# Patient Record
Sex: Female | Born: 1970 | Race: Black or African American | Hispanic: No | Marital: Single | Smoking: Never smoker
Health system: Southern US, Community
[De-identification: ages and names within clinical notes are randomized; demographics above are authoritative.]

## PROBLEM LIST (undated history)

## (undated) DIAGNOSIS — D509 Iron deficiency anemia, unspecified: Secondary | ICD-10-CM

## (undated) DIAGNOSIS — D219 Benign neoplasm of connective and other soft tissue, unspecified: Secondary | ICD-10-CM

## (undated) HISTORY — DX: Benign neoplasm of connective and other soft tissue, unspecified: D21.9

## (undated) HISTORY — PX: WISDOM TOOTH EXTRACTION: SHX21

## (undated) HISTORY — DX: Iron deficiency anemia, unspecified: D50.9

---

## 1999-03-13 ENCOUNTER — Other Ambulatory Visit: Admission: RE | Admit: 1999-03-13 | Discharge: 1999-03-13 | Payer: Self-pay | Admitting: Family Medicine

## 2000-07-30 HISTORY — PX: MYOMECTOMY: SHX85

## 2002-01-28 ENCOUNTER — Other Ambulatory Visit: Admission: RE | Admit: 2002-01-28 | Discharge: 2002-01-28 | Payer: Self-pay | Admitting: Obstetrics and Gynecology

## 2003-07-20 ENCOUNTER — Other Ambulatory Visit: Admission: RE | Admit: 2003-07-20 | Discharge: 2003-07-20 | Payer: Self-pay | Admitting: Obstetrics and Gynecology

## 2006-11-25 ENCOUNTER — Encounter: Admission: RE | Admit: 2006-11-25 | Discharge: 2006-11-25 | Payer: Self-pay | Admitting: Internal Medicine

## 2006-11-28 ENCOUNTER — Encounter: Admission: RE | Admit: 2006-11-28 | Discharge: 2006-11-28 | Payer: Self-pay | Admitting: Internal Medicine

## 2006-12-21 ENCOUNTER — Ambulatory Visit (HOSPITAL_COMMUNITY): Admission: RE | Admit: 2006-12-21 | Discharge: 2006-12-21 | Payer: Self-pay | Admitting: Obstetrics and Gynecology

## 2006-12-21 ENCOUNTER — Encounter (INDEPENDENT_AMBULATORY_CARE_PROVIDER_SITE_OTHER): Payer: Self-pay | Admitting: Obstetrics and Gynecology

## 2007-04-23 ENCOUNTER — Encounter: Admission: RE | Admit: 2007-04-23 | Discharge: 2007-04-23 | Payer: Self-pay | Admitting: Obstetrics and Gynecology

## 2007-10-29 ENCOUNTER — Encounter: Admission: RE | Admit: 2007-10-29 | Discharge: 2007-10-29 | Payer: Self-pay | Admitting: Neurosurgery

## 2007-11-03 ENCOUNTER — Encounter: Admission: RE | Admit: 2007-11-03 | Discharge: 2007-11-03 | Payer: Self-pay | Admitting: Internal Medicine

## 2008-07-17 ENCOUNTER — Encounter: Admission: RE | Admit: 2008-07-17 | Discharge: 2008-07-17 | Payer: Self-pay | Admitting: Neurosurgery

## 2010-02-06 ENCOUNTER — Encounter: Admission: RE | Admit: 2010-02-06 | Discharge: 2010-02-06 | Payer: Self-pay | Admitting: Neurosurgery

## 2010-08-20 ENCOUNTER — Encounter: Payer: Self-pay | Admitting: Internal Medicine

## 2010-12-12 NOTE — H&P (Signed)
NAMEANZLEY, DIBBERN               ACCOUNT NO.:  [id number]   MEDICAL RECORD NO.:  [id number]          PATIENT TYPE:  AMB   LOCATION:  [facility]                           FACILITY:  [facility]   PHYSICIAN:  Hal Morales, M.D.DATE OF BIRTH:  19-Jun-1971   DATE OF ADMISSION:  12/21/2006  DATE OF DISCHARGE:                              HISTORY & PHYSICAL   ADMISSION HISTORY AND PHYSICAL   HISTORY OF PRESENT ILLNESS:  The patient is a 40 year old Black single  female para 0 who presents for management of irregular intermenstrual  bleeding.  The patient had her last menstrual period on Dec 05, 2006 for  5 days.  Her previous menstrual period had been on November 03, 2006 for 5  days.  She had one day of spotting on November 18, 2006.  She has really  had a significant problem with her periods since her teenage years,  originally thought to be due to her weight.  Her menses can occur as  frequently as 30 days, but as infrequently as 60 days.  Approximately 6  months ago she had an episode of prolonged vaginal bleeding which has  not recurred.  She has noticed almost between every cycle that she will  have two to three days of lighter bleeding.  Current contraception is  abstinence.  Pap smears have always been normal.  Her last pap smear was  on November 06, 2006 and was normal.   PAST MEDICAL HISTORY:  OB/GYN:  The patient menarche at age 64 and has  always had irregular menses lasting for 4 to 5 days except for the one  episode of menorrhagia approximately 6 months ago.  She is not sexually  active and never has been.  Obstetrical history is negative.   MEDICAL HISTORY:  Medical history is positive for sickle cell trait.  She has had usual childhood diseases.   SURGICAL HISTORY:  Third molar extraction.   FAMILY HISTORY:  Positive for heart disease, asthma, cancer and migraine  headaches.   HABITS:  Diet regular, exercise rare.  The patient works at [employer].  She  does not smoke cigarettes and does not  drink alcohol.   REVIEW OF SYSTEMS:  Negative except as mentioned above.   PHYSICAL EXAMINATION:  GENERAL:  The patient is a thin Black female in  no acute distress.  Blood pressure is 96/68.  Weight is 106 lbs.  Height  is 5'6.  LUNGS:  Clear.  HEART:  Regular rate and rhythm.  ABDOMEN:  Without masses or organomegaly.  Pelvic EGD last within normal  limits.  The vagina is Rubus.  The cervix is without gross lesions,  though there is some contact bleeding.  The uterus is upper limits of  normal size, mobile and nontender.  Adnexa:  No masses.  Rectovaginal:  No masses.  Ultrasound evaluation shows a uterus that is 8 cm in length.  It contains two subserosal fibroids measuring 2.87 cm and 1.5 cm and two  submucosal fibroids on sonohysterogram.  One is anterofundal measuring  0.75 x 0.8 cm.  The other is posterior and near  the cervix measuring 1 x  1 cm.  The ovaries appear normal.   LABORATORY STUDIES:  PSH was 1.086 within normal limits.  The prolactin  was elevated at 49.6.   IMPRESSION:  1. Intermenstrual bleeding.  2. Uterine fibroids.  3. Hyperprolactinemia.   DISPOSITION:  A long discussion is held with the patient concerning  possible indications for her intermenstrual bleeding.  The management of  fibroids was reviewed and hysteroscopic resection of the intracavitary  fibroids was recommended and accepted.  The risks of anesthesia,  bleeding, infection, damage to adjacent organs and uterine perforation  were all discussed in detail.  The patient seemed to understand and  wishes to proceed.  At the time of her surgery with preoperative labs,  she will undergo a repeat prolactin level.      Hal Morales, M.D.  Electronically Signed     VPH/MEDQ  D:  12/12/2006  T:  12/12/2006  Job:  [id number]

## 2010-12-12 NOTE — Op Note (Signed)
[name], [name]               ACCOUNT NO.:  [id number]   MEDICAL RECORD NO.:  [id number]          PATIENT TYPE:  AMB   LOCATION:  [facility]                           FACILITY:  [facility]   PHYSICIAN:  Hal Morales, M.D.DATE OF BIRTH:  04/04/1971   DATE OF PROCEDURE:  12/21/2006  DATE OF DISCHARGE:                               OPERATIVE REPORT   PREOPERATIVE DIAGNOSES:  1. Internal intermenstrual bleeding.  2. Uterine fibroids.  3. Submucosal fibroids on sonohysterogram.   POSTOPERATIVE DIAGNOSES:  1. Internal intermenstrual bleeding.  2. Uterine fibroids.  3. Submucosal fibroids on sonohysterogram.   OPERATION:  Hysteroscopic resection of uterine fibroid and hysteroscopic  resection of a probable polyp.   SURGEON:  Dierdre Forth, M.D.   ANESTHESIA:  General orotracheal.   ESTIMATED BLOOD LOSS:  Less than 10 mL.   COMPLICATIONS:  None.   FINDINGS:  The uterus sounded to 8 cm.  At hysteroscopy, there was a 1-  cm posterior uterine fibroid.  There was a submucosal papillary mass in  the right anterolateral region quite close to the right ostium which  looked more polypoid.  The remainder of the endometrial cavity appeared  clear.   PROCEDURE:  The patient was taken to the operating room after  appropriate identification and placed on the operating table.  After the  attainment of adequate general anesthesia, she was placed in modified  lithotomy position.  The perineum and vagina were prepped with multiple  layers of Betadine and draped as a sterile field.  The patient had  emptied the bladder just prior to entering the operating room and no  catheterization was performed.  The perineum was draped as a sterile  field, and a small Peterson speculum placed in the vagina.  A  paracervical block was achieved with a total of 10 mL of 2% Xylocaine in  the 5 and 7 o'clock positions.  A single-tooth tenaculum was placed on  the cervix and the uterus sounded.  The uterus was then  dilated to  accommodate the diagnostic hysteroscope.  That was used to document the  above-noted findings.  The operative hysteroscope was then assembled and  placed in the endometrial cavity.  The 1-cm posterior fibroid was then  resected with a single loop resectoscope and chips from the fibroid  removed.  A similar procedure was used to resect what to be an  endometrial polyp near the right tubal ostium, and sharp curetting of  the entire endometrial cavity yielded minimal tissue.  The hysteroscope  was then replaced and the removal of the 2 noted lesions documented.  All instruments were then removed from the vagina including the  hysteroscope.  Hemostasis was noted to be adequate.  The patient was  given  Toradol 30 mg IV and 30 mg IM and awakened from general anesthesia.  She  was taken to the recovery room in satisfactory condition, having  tolerated the procedure well with sponge and instrument counts correct.   SPECIMENS TO PATHOLOGY:  Uterine fibroids and endometrial curettings.      Hal Morales, M.D.  Electronically Signed  VPH/MEDQ  D:  12/21/2006  T:  12/21/2006  Job:  [id number]

## 2010-12-19 ENCOUNTER — Other Ambulatory Visit: Payer: Self-pay | Admitting: Internal Medicine

## 2010-12-19 DIAGNOSIS — Z1231 Encounter for screening mammogram for malignant neoplasm of breast: Secondary | ICD-10-CM

## 2010-12-27 ENCOUNTER — Ambulatory Visit
Admission: RE | Admit: 2010-12-27 | Discharge: 2010-12-27 | Disposition: A | Discharge status: Home / Self Care | Payer: BC Managed Care – PPO | Source: Ambulatory Visit | Attending: Internal Medicine | Admitting: Internal Medicine

## 2010-12-27 DIAGNOSIS — Z1231 Encounter for screening mammogram for malignant neoplasm of breast: Secondary | ICD-10-CM

## 2012-01-16 ENCOUNTER — Other Ambulatory Visit: Payer: Self-pay | Admitting: Internal Medicine

## 2012-01-16 DIAGNOSIS — Z1231 Encounter for screening mammogram for malignant neoplasm of breast: Secondary | ICD-10-CM

## 2012-01-28 ENCOUNTER — Ambulatory Visit: Payer: BC Managed Care – PPO

## 2012-01-28 ENCOUNTER — Ambulatory Visit
Admission: RE | Admit: 2012-01-28 | Discharge: 2012-01-28 | Disposition: A | Discharge status: Home / Self Care | Payer: BC Managed Care – PPO | Source: Ambulatory Visit | Attending: Internal Medicine | Admitting: Internal Medicine

## 2012-01-28 DIAGNOSIS — Z1231 Encounter for screening mammogram for malignant neoplasm of breast: Secondary | ICD-10-CM

## 2012-02-15 ENCOUNTER — Ambulatory Visit (INDEPENDENT_AMBULATORY_CARE_PROVIDER_SITE_OTHER): Payer: BC Managed Care – PPO | Admitting: Emergency Medicine

## 2012-02-15 VITALS — BP 100/66 | HR 87 | Temp 98.2°F | Resp 16 | Ht 66.0 in | Wt 119.0 lb

## 2012-02-15 DIAGNOSIS — R42 Dizziness and giddiness: Secondary | ICD-10-CM

## 2012-02-15 LAB — BASIC METABOLIC PANEL
BUN: 16 mg/dL (ref 6–23)
CO2: 27 mEq/L (ref 19–32)
Calcium: 9.9 mg/dL (ref 8.4–10.5)
Chloride: 102 mEq/L (ref 96–112)
Creat: 0.73 mg/dL (ref 0.50–1.10)
Glucose, Bld: 84 mg/dL (ref 70–99)

## 2012-02-15 LAB — POCT CBC
Lymph, poc: 3.5 — AB (ref 0.6–3.4)
MCHC: 31.5 g/dL — AB (ref 31.8–35.4)
MID (cbc): 0.4 (ref 0–0.9)
MPV: 10 fL (ref 0–99.8)
Platelet Count, POC: 317 10*3/uL (ref 142–424)
RBC: 4.57 M/uL (ref 4.04–5.48)

## 2012-02-15 MED ORDER — MECLIZINE HCL 25 MG PO TABS: 50.0000 mg | ORAL_TABLET | Freq: Three times a day (TID) | ORAL | Status: AC | PRN

## 2012-02-15 NOTE — Patient Instructions (Signed)
Vertigo Vertigo means you feel like you or your surroundings are moving when they are not. Vertigo can be dangerous if it occurs when you are at work, driving, or performing difficult activities.  CAUSES  Vertigo occurs when there is a conflict of signals sent to your brain from the visual and sensory systems in your body. There are many different causes of vertigo, including:  Infections, especially in the inner ear.   A bad reaction to a drug or misuse of alcohol and medicines.   Withdrawal from drugs or alcohol.   Rapidly changing positions, such as lying down or rolling over in bed.   A migraine headache.   Decreased blood flow to the brain.   Increased pressure in the brain from a head injury, infection, tumor, or bleeding.  SYMPTOMS  You may feel as though the world is spinning around or you are falling to the ground. Because your balance is upset, vertigo can cause nausea and vomiting. You may have involuntary eye movements (nystagmus). DIAGNOSIS  Vertigo is usually diagnosed by physical exam. If the cause of your vertigo is unknown, your caregiver may perform imaging tests, such as an MRI scan (magnetic resonance imaging). TREATMENT  Most cases of vertigo resolve on their own, without treatment. Depending on the cause, your caregiver may prescribe certain medicines. If your vertigo is related to body position issues, your caregiver may recommend movements or procedures to correct the problem. In rare cases, if your vertigo is caused by certain inner ear problems, you may need surgery. HOME CARE INSTRUCTIONS   Follow your caregiver's instructions.   Avoid driving.   Avoid operating heavy machinery.   Avoid performing any tasks that would be dangerous to you or others during a vertigo episode.   Tell your caregiver if you notice that certain medicines seem to be causing your vertigo. Some of the medicines used to treat vertigo episodes can actually make them worse in some  people.  SEEK IMMEDIATE MEDICAL CARE IF:   Your medicines do not relieve your vertigo or are making it worse.   You develop problems with talking, walking, weakness, or using your arms, hands, or legs.   You develop severe headaches.   Your nausea or vomiting continues or gets worse.   You develop visual changes.   A family member notices behavioral changes.   Your condition gets worse.  MAKE SURE YOU:  Understand these instructions.   Will watch your condition.   Will get help right away if you are not doing well or get worse.  Document Released: 04/25/2005 Document Revised: 07/05/2011 Document Reviewed: 02/01/2011 [employer] Patient Information 2012 [employer]. 

## 2012-02-15 NOTE — Progress Notes (Deleted)
  Subjective:    Patient ID: Rebecca [name], female    DOB: Jan 04, 1971, 41 y.o.   MRN: [id number]  HPI    Review of Systems     Objective:   Physical Exam        Assessment & Plan:

## 2012-02-15 NOTE — Progress Notes (Signed)
   Date:  02/15/2012   Name:  Rebecca [name]   DOB:  31-Aug-1970   MRN:  [id number]  PCP:  No primary provider on file.    Chief Complaint: Dizziness, Nausea and Excessive Sweating   History of Present Illness:  Rebecca [name] is a 41 y.o. very pleasant female patient who presents with the following:  Sitting yesterday in a meeting after lunch and turned her head quickly to the side to look at a coworker.  Felt very dizzy and lightheaded for a short time associated with cold sweat and nausea and a headache.  She lay down for 20 minutes and recovered to the point that she could go home.  Had no numbness, weakness, visual or other neurological symptoms.  Has no history of similar episodes.  Symptoms completely resolved by morning when she arose.  No further dizziness.  There is no problem list on file for this patient.   No past medical history on file.  No past surgical history on file.  History  Substance Use Topics  . Smoking status: Never Smoker   . Smokeless tobacco: Not on file  . Alcohol Use: Not on file    No family history on file.  No Known Allergies  Medication list has been reviewed and updated.  No current outpatient prescriptions on file prior to visit.    Review of Systems:  As per HPI, otherwise negative.    Physical Examination: Filed Vitals:   02/15/12 1511  BP: 100/66  Pulse: 87  Temp: 98.2 F (36.8 C)  Resp: 16   Filed Vitals:   02/15/12 1511  Height: 5\' 6"  (1.676 m)  Weight: 119 lb (53.978 kg)   Body mass index is 19.21 kg/(m^2). Ideal Body Weight: Weight in (lb) to have BMI = 25: 154.6   GEN: WDWN, NAD, Non-toxic, A & O x 3 HEENT: Atraumatic, Normocephalic. Neck supple. No masses, No LAD. Ears and Nose: No external deformity. CV: RRR, No M/G/R. No JVD. No thrill. No extra heart sounds. PULM: CTA B, no wheezes, crackles, rhonchi. No retractions. No resp. distress. No accessory muscle use. ABD: S, NT, ND, +BS. No rebound. No  HSM. EXTR: No c/c/e NEURO Normal gait.  PSYCH: Normally interactive. Conversant. Not depressed or anxious appearing.  Calm demeanor.    Assessment and Plan: Vertigo  Labs antivert Follow up if no improvement  Dareen Piano Tessa Lerner, MD

## 2012-02-20 ENCOUNTER — Encounter: Payer: Self-pay | Admitting: Emergency Medicine

## 2012-02-25 ENCOUNTER — Telehealth: Payer: Self-pay

## 2012-02-25 NOTE — Telephone Encounter (Signed)
Pt calling about her lab results done on July 19th please call back at [phone]

## 2012-02-25 NOTE — Telephone Encounter (Signed)
Pt notified that a normal lab letter was mailed.

## 2012-03-06 ENCOUNTER — Other Ambulatory Visit: Payer: Self-pay | Admitting: Neurosurgery

## 2012-03-06 DIAGNOSIS — D353 Benign neoplasm of craniopharyngeal duct: Secondary | ICD-10-CM

## 2012-03-06 DIAGNOSIS — D352 Benign neoplasm of pituitary gland: Secondary | ICD-10-CM

## 2012-08-06 ENCOUNTER — Other Ambulatory Visit: Payer: Self-pay | Admitting: Neurosurgery

## 2012-08-06 DIAGNOSIS — D352 Benign neoplasm of pituitary gland: Secondary | ICD-10-CM

## 2012-08-06 DIAGNOSIS — D353 Benign neoplasm of craniopharyngeal duct: Secondary | ICD-10-CM

## 2012-08-09 ENCOUNTER — Ambulatory Visit
Admission: RE | Admit: 2012-08-09 | Discharge: 2012-08-09 | Disposition: A | Discharge status: Home / Self Care | Payer: BC Managed Care – PPO | Source: Ambulatory Visit | Attending: Neurosurgery | Admitting: Neurosurgery

## 2012-08-09 DIAGNOSIS — D352 Benign neoplasm of pituitary gland: Secondary | ICD-10-CM

## 2012-08-09 MED ORDER — GADOBENATE DIMEGLUMINE 529 MG/ML IV SOLN
6.0000 mL | Freq: Once | INTRAVENOUS | Status: AC | PRN
  Administered 2012-08-09: 6 mL via INTRAVENOUS

## 2013-02-20 ENCOUNTER — Other Ambulatory Visit: Payer: Self-pay | Admitting: Internal Medicine

## 2013-02-20 ENCOUNTER — Other Ambulatory Visit: Payer: Self-pay

## 2013-02-20 DIAGNOSIS — Z1231 Encounter for screening mammogram for malignant neoplasm of breast: Secondary | ICD-10-CM

## 2013-03-11 ENCOUNTER — Ambulatory Visit
Admission: RE | Admit: 2013-03-11 | Discharge: 2013-03-11 | Disposition: A | Discharge status: Home / Self Care | Payer: BC Managed Care – PPO | Source: Ambulatory Visit

## 2013-03-11 DIAGNOSIS — Z1231 Encounter for screening mammogram for malignant neoplasm of breast: Secondary | ICD-10-CM

## 2014-02-05 ENCOUNTER — Other Ambulatory Visit: Payer: Self-pay

## 2014-02-05 DIAGNOSIS — Z1231 Encounter for screening mammogram for malignant neoplasm of breast: Secondary | ICD-10-CM

## 2014-03-17 ENCOUNTER — Ambulatory Visit
Admission: RE | Admit: 2014-03-17 | Discharge: 2014-03-17 | Disposition: A | Discharge status: Home / Self Care | Payer: BC Managed Care – PPO | Source: Ambulatory Visit

## 2014-03-17 DIAGNOSIS — Z1231 Encounter for screening mammogram for malignant neoplasm of breast: Secondary | ICD-10-CM

## 2015-03-24 ENCOUNTER — Other Ambulatory Visit: Payer: Self-pay

## 2015-03-24 DIAGNOSIS — Z1231 Encounter for screening mammogram for malignant neoplasm of breast: Secondary | ICD-10-CM

## 2015-05-12 ENCOUNTER — Ambulatory Visit: Payer: BC Managed Care – PPO

## 2015-05-19 ENCOUNTER — Ambulatory Visit
Admission: RE | Admit: 2015-05-19 | Discharge: 2015-05-19 | Disposition: A | Discharge status: Home / Self Care | Payer: BC Managed Care – PPO | Source: Ambulatory Visit

## 2015-05-19 DIAGNOSIS — Z1231 Encounter for screening mammogram for malignant neoplasm of breast: Secondary | ICD-10-CM

## 2015-07-15 ENCOUNTER — Ambulatory Visit
Admission: RE | Admit: 2015-07-15 | Discharge: 2015-07-15 | Disposition: A | Discharge status: Home / Self Care | Payer: BC Managed Care – PPO | Source: Ambulatory Visit | Attending: Internal Medicine | Admitting: Internal Medicine

## 2015-07-15 ENCOUNTER — Other Ambulatory Visit: Payer: Self-pay | Admitting: Nurse Practitioner

## 2015-07-15 ENCOUNTER — Other Ambulatory Visit: Payer: Self-pay | Admitting: Internal Medicine

## 2015-07-15 DIAGNOSIS — R52 Pain, unspecified: Secondary | ICD-10-CM

## 2016-05-08 ENCOUNTER — Other Ambulatory Visit: Payer: Self-pay | Admitting: Internal Medicine

## 2016-05-08 DIAGNOSIS — Z1231 Encounter for screening mammogram for malignant neoplasm of breast: Secondary | ICD-10-CM

## 2016-05-21 ENCOUNTER — Ambulatory Visit
Admission: RE | Admit: 2016-05-21 | Discharge: 2016-05-21 | Disposition: A | Discharge status: Home / Self Care | Payer: BC Managed Care – PPO | Source: Ambulatory Visit | Attending: Internal Medicine | Admitting: Internal Medicine

## 2016-05-21 DIAGNOSIS — Z1231 Encounter for screening mammogram for malignant neoplasm of breast: Secondary | ICD-10-CM

## 2017-02-18 LAB — CBC AND DIFFERENTIAL
HCT: 35 — AB (ref 36–46)
Hemoglobin: 11.4 — AB (ref 12.0–16.0)
Platelets: 339 (ref 150–399)
WBC: 6.1

## 2017-02-18 LAB — HEPATIC FUNCTION PANEL
ALT: 10 (ref 7–35)
AST: 18 (ref 13–35)
Alkaline Phosphatase: 59 (ref 25–125)
Bilirubin, Total: 0.3

## 2017-02-18 LAB — LIPID PANEL
Cholesterol: 155 (ref 0–200)
HDL: 74 — AB (ref 35–70)
LDL Cholesterol: 69
LDl/HDL Ratio: 0.9
Triglycerides: 59 (ref 40–160)

## 2017-02-18 LAB — TSH: TSH: 2 (ref 0.41–5.90)

## 2017-02-18 LAB — BASIC METABOLIC PANEL
BUN: 10 (ref 4–21)
Creatinine: 0.8 (ref 0.5–1.1)
Glucose: 91
Potassium: 4.4 (ref 3.4–5.3)
SODIUM: 141 (ref 137–147)

## 2017-02-18 LAB — VITAMIN D 25 HYDROXY (VIT D DEFICIENCY, FRACTURES): Vit D, 25-Hydroxy: 22.3

## 2017-02-18 LAB — HEMOGLOBIN A1C: Hemoglobin A1C: 5.1

## 2017-04-15 ENCOUNTER — Other Ambulatory Visit: Payer: Self-pay | Admitting: Internal Medicine

## 2017-04-15 DIAGNOSIS — Z1239 Encounter for other screening for malignant neoplasm of breast: Secondary | ICD-10-CM

## 2017-05-29 ENCOUNTER — Ambulatory Visit: Payer: BC Managed Care – PPO

## 2017-06-18 ENCOUNTER — Ambulatory Visit
Admission: RE | Admit: 2017-06-18 | Discharge: 2017-06-18 | Disposition: A | Discharge status: Home / Self Care | Payer: BC Managed Care – PPO | Source: Ambulatory Visit | Attending: Internal Medicine | Admitting: Internal Medicine

## 2017-06-18 DIAGNOSIS — Z1239 Encounter for other screening for malignant neoplasm of breast: Secondary | ICD-10-CM

## 2017-06-26 DIAGNOSIS — M79641 Pain in right hand: Secondary | ICD-10-CM | POA: Insufficient documentation

## 2017-06-26 DIAGNOSIS — M79644 Pain in right finger(s): Secondary | ICD-10-CM | POA: Insufficient documentation

## 2018-04-16 ENCOUNTER — Encounter: Payer: Self-pay | Admitting: Internal Medicine

## 2018-04-16 DIAGNOSIS — M79641 Pain in right hand: Secondary | ICD-10-CM

## 2018-04-28 DIAGNOSIS — D259 Leiomyoma of uterus, unspecified: Secondary | ICD-10-CM | POA: Insufficient documentation

## 2018-04-28 DIAGNOSIS — N915 Oligomenorrhea, unspecified: Secondary | ICD-10-CM | POA: Insufficient documentation

## 2018-04-28 DIAGNOSIS — E221 Hyperprolactinemia: Secondary | ICD-10-CM | POA: Insufficient documentation

## 2018-04-28 DIAGNOSIS — D352 Benign neoplasm of pituitary gland: Secondary | ICD-10-CM | POA: Insufficient documentation

## 2018-04-29 ENCOUNTER — Ambulatory Visit: Payer: BC Managed Care – PPO | Admitting: Internal Medicine

## 2018-04-29 ENCOUNTER — Encounter: Payer: Self-pay | Admitting: Internal Medicine

## 2018-04-29 VITALS — BP 112/60 | HR 78 | Temp 98.0°F | Ht 65.0 in | Wt 127.6 lb

## 2018-04-29 DIAGNOSIS — F5089 Other specified eating disorder: Secondary | ICD-10-CM

## 2018-04-29 DIAGNOSIS — N920 Excessive and frequent menstruation with regular cycle: Secondary | ICD-10-CM | POA: Diagnosis not present

## 2018-04-29 DIAGNOSIS — Z0000 Encounter for general adult medical examination without abnormal findings: Secondary | ICD-10-CM

## 2018-04-29 LAB — POCT URINALYSIS DIPSTICK
Bilirubin, UA: NEGATIVE
GLUCOSE UA: NEGATIVE
Ketones, UA: NEGATIVE
Nitrite, UA: NEGATIVE
PROTEIN UA: NEGATIVE
Spec Grav, UA: 1.01 (ref 1.010–1.025)
Urobilinogen, UA: NEGATIVE E.U./dL — AB
pH, UA: 6.5 (ref 5.0–8.0)

## 2018-04-29 NOTE — Progress Notes (Addendum)
   Subjective:    Patient ID: Rebecca [name], female    DOB: 1971-03-13, 47 y.o.   MRN: [id number]  SHE IS HERE TODAY FOR A FULL PHYSICAL EXAM.  She is followed by GYN for her pelvic exams.     Review of Systems  Constitutional: Positive for appetite change. Negative for chills, diaphoresis, fatigue and fever.       SHE C/O CRAVINGS FOR ICE. STARTED SEVERAL MONTHS AGO.   HENT: Negative.   Eyes: Negative.   Respiratory: Negative.   Cardiovascular: Negative.   Gastrointestinal: Negative.   Endocrine: Negative.   Genitourinary: Menstrual problem: SHE COMPLAINS HER CYCLES HAVE GOTTEN HEAVIER. THIS STARTED ABOUT 4 MONTHS AGO.  LMP 9/27.   History reviewed. No pertinent past medical history. Vitals:   04/29/18 1058  BP: 112/60  Pulse: 78  Temp: 98 F (36.7 C)   Vitals:   04/29/18 1058  Weight: 127 lb 9.6 oz (57.9 kg)  Height: 5\' 5"  (1.651 m)      Objective:   Physical Exam  Constitutional: She is oriented to person, place, and time. She appears well-developed and well-nourished.  HENT:  Head: Normocephalic.  Right Ear: External ear normal.  Left Ear: External ear normal.  Nose: Nose normal.  Mouth/Throat: Oropharynx is clear and moist.  Eyes: Pupils are equal, round, and reactive to light. EOM are normal.  PALE CONJUNCTIVA  Neck: Normal range of motion. Neck supple.  Cardiovascular: Normal rate, regular rhythm, normal heart sounds and intact distal pulses.  Pulmonary/Chest: Effort normal and breath sounds normal. Right breast exhibits no inverted nipple, no mass, no nipple discharge, no skin change and no tenderness. Left breast exhibits no inverted nipple, no mass, no nipple discharge, no skin change and no tenderness.  Abdominal: Soft. Bowel sounds are normal.  Musculoskeletal: Normal range of motion.  Neurological: She is alert and oriented to person, place, and time.  Skin: Skin is warm and dry.  Psychiatric: She has a normal mood and affect.  Nursing note and  vitals reviewed.         Assessment & Plan:  Routine general medical examination at a health care facility - FULL EXAM PERFORMED.  IMPORTANCE OF MONTHLY SELF BREAST EXAMS WAS DISCUSSED WITH THE PATIENT.  SHE IS ENCOURAGED TO FOLLOW A LOW-GLYCEMIC DIET.  - Plan: CBC no Diff, CMP w Anion Gap (STAT/Sunquest-performed on-site), Hemoglobin A1c, Lipid Profile, POCT Urinalysis Dipstick (81002)  Pica in adults - I WILL CHECK IRON LEVELS TODAY.   Menorrhagia with regular cycle - I WILL CHECK IRON LEVELS AND TSH TODAY. PT ADVISED THAT SHE NEEDS TO F/U WITH GYN. UTERINE U/S MAY BE NEEDED.  - Plan: Iron and IBC ([id number]), Ferritin

## 2018-04-29 NOTE — Patient Instructions (Addendum)
Health Maintenance, Female Adopting a healthy lifestyle and getting preventive care can go a long way to promote health and wellness. Talk with your health care provider about what schedule of regular examinations is right for you. This is a good chance for you to check in with your provider about disease prevention and staying healthy. In between checkups, there are plenty of things you can do on your own. Experts have done a lot of research about which lifestyle changes and preventive measures are most likely to keep you healthy. Ask your health care provider for more information. Weight and diet Eat a healthy diet  Be sure to include plenty of vegetables, fruits, low-fat dairy products, and lean protein.  Do not eat a lot of foods high in solid fats, added sugars, or salt.  Get regular exercise. This is one of the most important things you can do for your health. ? Most adults should exercise for at least 150 minutes each week. The exercise should increase your heart rate and make you sweat (moderate-intensity exercise). ? Most adults should also do strengthening exercises at least twice a week. This is in addition to the moderate-intensity exercise.  Maintain a healthy weight  Body mass index (BMI) is a measurement that can be used to identify possible weight problems. It estimates body fat based on height and weight. Your health care provider can help determine your BMI and help you achieve or maintain a healthy weight.  For females 20 years of age and older: ? A BMI below 18.5 is considered underweight. ? A BMI of 18.5 to 24.9 is normal. ? A BMI of 25 to 29.9 is considered overweight. ? A BMI of 30 and above is considered obese.  Watch levels of cholesterol and blood lipids  You should start having your blood tested for lipids and cholesterol at 47 years of age, then have this test every 5 years.  You may need to have your cholesterol levels checked more often if: ? Your lipid or  cholesterol levels are high. ? You are older than 47 years of age. ? You are at high risk for heart disease.  Cancer screening Lung Cancer  Lung cancer screening is recommended for adults 55-80 years old who are at high risk for lung cancer because of a history of smoking.  A yearly low-dose CT scan of the lungs is recommended for people who: ? Currently smoke. ? Have quit within the past 15 years. ? Have at least a 30-pack-year history of smoking. A pack year is smoking an average of one pack of cigarettes a day for 1 year.  Yearly screening should continue until it has been 15 years since you quit.  Yearly screening should stop if you develop a health problem that would prevent you from having lung cancer treatment.  Breast Cancer  Practice breast self-awareness. This means understanding how your breasts normally appear and feel.  It also means doing regular breast self-exams. Let your health care provider know about any changes, no matter how small.  If you are in your 20s or 30s, you should have a clinical breast exam (CBE) by a health care provider every 1-3 years as part of a regular health exam.  If you are 40 or older, have a CBE every year. Also consider having a breast X-ray (mammogram) every year.  If you have a family history of breast cancer, talk to your health care provider about genetic screening.  If you are at high risk   for breast cancer, talk to your health care provider about having an MRI and a mammogram every year.  Breast cancer gene (BRCA) assessment is recommended for women who have family members with BRCA-related cancers. BRCA-related cancers include: ? Breast. ? Ovarian. ? Tubal. ? Peritoneal cancers.  Results of the assessment will determine the need for genetic counseling and BRCA1 and BRCA2 testing.  Cervical Cancer Your health care provider may recommend that you be screened regularly for cancer of the pelvic organs (ovaries, uterus, and  vagina). This screening involves a pelvic examination, including checking for microscopic changes to the surface of your cervix (Pap test). You may be encouraged to have this screening done every 3 years, beginning at age 22.  For women ages 56-65, health care providers may recommend pelvic exams and Pap testing every 3 years, or they may recommend the Pap and pelvic exam, combined with testing for human papilloma virus (HPV), every 5 years. Some types of HPV increase your risk of cervical cancer. Testing for HPV may also be done on women of any age with unclear Pap test results.  Other health care providers may not recommend any screening for nonpregnant women who are considered low risk for pelvic cancer and who do not have symptoms. Ask your health care provider if a screening pelvic exam is right for you.  If you have had past treatment for cervical cancer or a condition that could lead to cancer, you need Pap tests and screening for cancer for at least 20 years after your treatment. If Pap tests have been discontinued, your risk factors (such as having a new sexual partner) need to be reassessed to determine if screening should resume. Some women have medical problems that increase the chance of getting cervical cancer. In these cases, your health care provider may recommend more frequent screening and Pap tests.  Colorectal Cancer  This type of cancer can be detected and often prevented.  Routine colorectal cancer screening usually begins at 47 years of age and continues through 47 years of age.  Your health care provider may recommend screening at an earlier age if you have risk factors for colon cancer.  Your health care provider may also recommend using home test kits to check for hidden blood in the stool.  A small camera at the end of a tube can be used to examine your colon directly (sigmoidoscopy or colonoscopy). This is done to check for the earliest forms of colorectal  cancer.  Routine screening usually begins at age 33.  Direct examination of the colon should be repeated every 5-10 years through 47 years of age. However, you may need to be screened more often if early forms of precancerous polyps or small growths are found.  Skin Cancer  Check your skin from head to toe regularly.  Tell your health care provider about any new moles or changes in moles, especially if there is a change in a mole's shape or color.  Also tell your health care provider if you have a mole that is larger than the size of a pencil eraser.  Always use sunscreen. Apply sunscreen liberally and repeatedly throughout the day.  Protect yourself by wearing long sleeves, pants, a wide-brimmed hat, and sunglasses whenever you are outside.  Heart disease, diabetes, and high blood pressure  High blood pressure causes heart disease and increases the risk of stroke. High blood pressure is more likely to develop in: ? People who have blood pressure in the high end of  the normal range (130-139/85-89 mm Hg). ? People who are overweight or obese. ? People who are African American.  If you are 21-29 years of age, have your blood pressure checked every 3-5 years. If you are 3 years of age or older, have your blood pressure checked every year. You should have your blood pressure measured twice-once when you are at a hospital or clinic, and once when you are not at a hospital or clinic. Record the average of the two measurements. To check your blood pressure when you are not at a hospital or clinic, you can use: ? An automated blood pressure machine at a pharmacy. ? A home blood pressure monitor.  If you are between 17 years and 37 years old, ask your health care provider if you should take aspirin to prevent strokes.  Have regular diabetes screenings. This involves taking a blood sample to check your fasting blood sugar level. ? If you are at a normal weight and have a low risk for diabetes,  have this test once every three years after 47 years of age. ? If you are overweight and have a high risk for diabetes, consider being tested at a younger age or more often. Preventing infection Hepatitis B  If you have a higher risk for hepatitis B, you should be screened for this virus. You are considered at high risk for hepatitis B if: ? You were born in a country where hepatitis B is common. Ask your health care provider which countries are considered high risk. ? Your parents were born in a high-risk country, and you have not been immunized against hepatitis B (hepatitis B vaccine). ? You have HIV or AIDS. ? You use needles to inject street drugs. ? You live with someone who has hepatitis B. ? You have had sex with someone who has hepatitis B. ? You get hemodialysis treatment. ? You take certain medicines for conditions, including cancer, organ transplantation, and autoimmune conditions.  Hepatitis C  Blood testing is recommended for: ? Everyone born from [birth year] through 1965. ? Anyone with known risk factors for hepatitis C.  Sexually transmitted infections (STIs)  You should be screened for sexually transmitted infections (STIs) including gonorrhea and chlamydia if: ? You are sexually active and are younger than 47 years of age. ? You are older than 47 years of age and your health care provider tells you that you are at risk for this type of infection. ? Your sexual activity has changed since you were last screened and you are at an increased risk for chlamydia or gonorrhea. Ask your health care provider if you are at risk.  If you do not have HIV, but are at risk, it may be recommended that you take a prescription medicine daily to prevent HIV infection. This is called pre-exposure prophylaxis (PrEP). You are considered at risk if: ? You are sexually active and do not regularly use condoms or know the HIV status of your partner(s). ? You take drugs by injection. ? You are  sexually active with a partner who has HIV.  Talk with your health care provider about whether you are at high risk of being infected with HIV. If you choose to begin PrEP, you should first be tested for HIV. You should then be tested every 3 months for as long as you are taking PrEP. Pregnancy  If you are premenopausal and you may become pregnant, ask your health care provider about preconception counseling.  If you may become  pregnant, take 400 to 800 micrograms (mcg) of folic acid every day.  If you want to prevent pregnancy, talk to your health care provider about birth control (contraception). Osteoporosis and menopause  Osteoporosis is a disease in which the bones lose minerals and strength with aging. This can result in serious bone fractures. Your risk for osteoporosis can be identified using a bone density scan.  If you are 41 years of age or older, or if you are at risk for osteoporosis and fractures, ask your health care provider if you should be screened.  Ask your health care provider whether you should take a calcium or vitamin D supplement to lower your risk for osteoporosis.  Menopause may have certain physical symptoms and risks.  Hormone replacement therapy may reduce some of these symptoms and risks. Talk to your health care provider about whether hormone replacement therapy is right for you. Follow these instructions at home:  Schedule regular health, dental, and eye exams.  Stay current with your immunizations.  Do not use any tobacco products including cigarettes, chewing tobacco, or electronic cigarettes.  If you are pregnant, do not drink alcohol.  If you are breastfeeding, limit how much and how often you drink alcohol.  Limit alcohol intake to no more than 1 drink per day for nonpregnant women. One drink equals 12 ounces of beer, 5 ounces of wine, or 1 ounces of hard liquor.  Do not use street drugs.  Do not share needles.  Ask your health care  provider for help if you need support or information about quitting drugs.  Tell your health care provider if you often feel depressed.  Tell your health care provider if you have ever been abused or do not feel safe at home. This information is not intended to replace advice given to you by your health care provider. Make sure you discuss any questions you have with your health care provider. Document Released: 01/29/2011 Document Revised: 12/22/2015 Document Reviewed: 04/19/2015 Elsevier Interactive Patient Education  2018 [employer].  Iron Deficiency Anemia, Adult Iron-deficiency anemia is when you have a low amount of red blood cells or hemoglobin. This happens because you have too little iron in your body. Hemoglobin carries oxygen to parts of the body. Anemia can cause your body to not get enough oxygen. It may or may not cause symptoms. Follow these instructions at home: Medicines  Take over-the-counter and prescription medicines only as told by your doctor. This includes iron pills (supplements) and vitamins.  If you cannot handle taking iron pills by mouth, ask your doctor about getting iron through: ? A vein (intravenously). ? A shot (injection) into a muscle.  Take iron pills when your stomach is empty. If you cannot handle this, take them with food.  Do not drink milk or take antacids at the same time as your iron pills.  To prevent trouble pooping (constipation), eat fiber or take medicine (stool softener) as told by your doctor. Eating and drinking  Talk with your doctor before changing the foods you eat. He or she may tell you to eat foods that have a lot of iron, such as: ? Liver. ? Lowfat (lean) beef. ? Breads and cereals that have iron added to them (fortified breads and cereals). ? Eggs. ? Dried fruit. ? Dark green, leafy vegetables.  Drink enough fluid to keep your pee (urine) clear or pale yellow.  Eat fresh fruits and vegetables that are high in vitamin  C. They help your body to  use iron. Foods with a lot of vitamin C include: ? Oranges. ? Peppers. ? Tomatoes. ? Mangoes. General instructions  Return to your normal activities as told by your doctor. Ask your doctor what activities are safe for you.  Keep yourself clean, and keep things clean around you (your surroundings). Anemia can make you get sick more easily.  Keep all follow-up visits as told by your doctor. This is important. Contact a doctor if:  You feel sick to your stomach (nauseous).  You throw up (vomit).  You feel weak.  You are sweating for no clear reason.  You have trouble pooping, such as: ? Pooping (having a bowel movement) less than 3 times a week. ? Straining to poop. ? Having poop that is hard, dry, or larger than normal. ? Feeling full or bloated. ? Pain in the lower belly. ? Not feeling better after pooping. Get help right away if:  You pass out (faint). If this happens, do not drive yourself to the hospital. Call your local emergency services (911 in the [place]  You have chest pain.  You have shortness of breath that: ? Is very bad. ? Gets worse with physical activity.  You have a fast heartbeat.  You get light-headed when getting up from sitting or lying down. This information is not intended to replace advice given to you by your health care provider. Make sure you discuss any questions you have with your health care provider. Document Released: 08/18/2010 Document Revised: 04/04/2016 Document Reviewed: 04/04/2016 Elsevier Interactive Patient Education  2017 [employer].

## 2018-04-30 LAB — LIPID PANEL
CHOL/HDL RATIO: 2.2 ratio (ref 0.0–4.4)
Cholesterol, Total: 148 mg/dL (ref 100–199)
HDL: 68 mg/dL (ref >39)
LDL Calculated: 65 mg/dL (ref 0–99)
Triglycerides: 77 mg/dL (ref 0–149)
VLDL CHOLESTEROL CAL: 15 mg/dL (ref 5–40)

## 2018-04-30 LAB — FERRITIN: Ferritin: 5 ng/mL — ABNORMAL LOW (ref 15–150)

## 2018-04-30 LAB — HEMOGLOBIN A1C
Est. average glucose Bld gHb Est-mCnc: 111 mg/dL
HEMOGLOBIN A1C: 5.5 % (ref 4.8–5.6)

## 2018-04-30 LAB — CBC
HEMATOCRIT: 31.7 % — AB (ref 34.0–46.6)
HEMOGLOBIN: 9.7 g/dL — AB (ref 11.1–15.9)
MCH: 22.6 pg — ABNORMAL LOW (ref 26.6–33.0)
MCHC: 30.6 g/dL — ABNORMAL LOW (ref 31.5–35.7)
MCV: 74 fL — ABNORMAL LOW (ref 79–97)
Platelets: 400 10*3/uL (ref 150–450)
RBC: 4.29 x10E6/uL (ref 3.77–5.28)
RDW: 15.5 % — AB (ref 12.3–15.4)
WBC: 7.8 10*3/uL (ref 3.4–10.8)

## 2018-04-30 LAB — IRON AND TIBC
Iron Saturation: 5 % — CL (ref 15–55)
Iron: 23 ug/dL — ABNORMAL LOW (ref 27–159)
TIBC: 469 ug/dL — AB (ref 250–450)
UIBC: 446 ug/dL — AB (ref 131–425)

## 2018-05-06 ENCOUNTER — Other Ambulatory Visit: Payer: Self-pay

## 2018-05-06 MED ORDER — FUSION PLUS PO CAPS: 1.0000 | ORAL_CAPSULE | Freq: Every day | ORAL | 1 refills | Status: DC

## 2018-05-20 ENCOUNTER — Other Ambulatory Visit: Payer: Self-pay | Admitting: Internal Medicine

## 2018-05-20 DIAGNOSIS — Z1231 Encounter for screening mammogram for malignant neoplasm of breast: Secondary | ICD-10-CM

## 2018-07-02 ENCOUNTER — Ambulatory Visit
Admission: RE | Admit: 2018-07-02 | Discharge: 2018-07-02 | Disposition: A | Discharge status: Home / Self Care | Payer: BC Managed Care – PPO | Source: Ambulatory Visit | Attending: Internal Medicine | Admitting: Internal Medicine

## 2018-07-02 DIAGNOSIS — Z1231 Encounter for screening mammogram for malignant neoplasm of breast: Secondary | ICD-10-CM

## 2018-07-08 ENCOUNTER — Other Ambulatory Visit: Payer: Self-pay | Admitting: Internal Medicine

## 2018-08-04 ENCOUNTER — Encounter: Payer: Self-pay | Admitting: Internal Medicine

## 2018-08-18 ENCOUNTER — Ambulatory Visit: Payer: BC Managed Care – PPO | Admitting: Internal Medicine

## 2018-08-22 ENCOUNTER — Ambulatory Visit: Payer: BC Managed Care – PPO | Admitting: Internal Medicine

## 2019-05-04 ENCOUNTER — Encounter: Payer: BC Managed Care – PPO | Admitting: Internal Medicine

## 2019-09-24 ENCOUNTER — Other Ambulatory Visit: Payer: Self-pay | Admitting: Internal Medicine

## 2019-09-24 DIAGNOSIS — Z1231 Encounter for screening mammogram for malignant neoplasm of breast: Secondary | ICD-10-CM

## 2019-10-02 ENCOUNTER — Ambulatory Visit
Admission: RE | Admit: 2019-10-02 | Discharge: 2019-10-02 | Disposition: A | Discharge status: Home / Self Care | Payer: BC Managed Care – PPO | Source: Ambulatory Visit | Attending: Internal Medicine | Admitting: Internal Medicine

## 2019-10-02 ENCOUNTER — Ambulatory Visit: Payer: BC Managed Care – PPO | Attending: Internal Medicine

## 2019-10-02 ENCOUNTER — Other Ambulatory Visit: Payer: Self-pay

## 2019-10-02 DIAGNOSIS — Z23 Encounter for immunization: Secondary | ICD-10-CM | POA: Insufficient documentation

## 2019-10-02 DIAGNOSIS — Z1231 Encounter for screening mammogram for malignant neoplasm of breast: Secondary | ICD-10-CM

## 2019-10-02 NOTE — Progress Notes (Signed)
   Covid-19 Vaccination Clinic  Name:  Rebecca [name]    MRN: [id number] DOB: 03-23-1971  10/02/2019  Rebecca [name] was observed post Covid-19 immunization for 30 minutes based on pre-vaccination screening without incident. She was provided with Vaccine Information Sheet and instruction to access the V-Safe system.   Rebecca [name] was instructed to call 911 with any severe reactions post vaccine: Marland Kitchen Difficulty breathing  . Swelling of face and throat  . A fast heartbeat  . A bad rash all over body  . Dizziness and weakness   Patient stated her throat was feeling a little tight.  Gave patient a bottle of water and asked her to stay extra time.  After an additional 15 minutes, she stated she no longer felt like her throat was tight like before.  Patient was observed for a total of 30 minutes.

## 2019-11-03 ENCOUNTER — Ambulatory Visit: Payer: BC Managed Care – PPO | Attending: Internal Medicine

## 2019-11-03 DIAGNOSIS — Z23 Encounter for immunization: Secondary | ICD-10-CM

## 2019-11-03 NOTE — Progress Notes (Signed)
   Covid-19 Vaccination Clinic  Name:  Rebecca [name]    MRN: [id number] DOB: 01/13/1971  11/03/2019  Rebecca [name] was observed post Covid-19 immunization for 15 minutes without incident. She was provided with Vaccine Information Sheet and instruction to access the V-Safe system.   Rebecca [name] was instructed to call 911 with any severe reactions post vaccine: Marland Kitchen Difficulty breathing  . Swelling of face and throat  . A fast heartbeat  . A bad rash all over body  . Dizziness and weakness   Immunizations Administered    Name Date Dose VIS Date Route   Pfizer COVID-19 Vaccine 11/03/2019  4:20 PM 0.3 mL 07/10/2019 Intramuscular   Manufacturer: Veteran   Lot: [id number]   [place]: [id number]

## 2020-03-30 ENCOUNTER — Encounter: Payer: Self-pay | Admitting: Internal Medicine

## 2020-03-31 ENCOUNTER — Other Ambulatory Visit: Payer: Self-pay

## 2020-03-31 ENCOUNTER — Ambulatory Visit: Payer: BC Managed Care – PPO | Admitting: Internal Medicine

## 2020-03-31 ENCOUNTER — Encounter: Payer: Self-pay | Admitting: Internal Medicine

## 2020-03-31 VITALS — BP 110/60 | HR 82 | Temp 98.1°F | Ht 65.0 in | Wt 131.6 lb

## 2020-03-31 DIAGNOSIS — D352 Benign neoplasm of pituitary gland: Secondary | ICD-10-CM

## 2020-03-31 DIAGNOSIS — Z1211 Encounter for screening for malignant neoplasm of colon: Secondary | ICD-10-CM | POA: Diagnosis not present

## 2020-03-31 DIAGNOSIS — F5089 Other specified eating disorder: Secondary | ICD-10-CM

## 2020-03-31 DIAGNOSIS — E221 Hyperprolactinemia: Secondary | ICD-10-CM

## 2020-03-31 DIAGNOSIS — Z0000 Encounter for general adult medical examination without abnormal findings: Secondary | ICD-10-CM

## 2020-03-31 DIAGNOSIS — D353 Benign neoplasm of craniopharyngeal duct: Secondary | ICD-10-CM

## 2020-03-31 NOTE — Progress Notes (Signed)
I,Tianna Badgett,acting as a Education administrator for Maximino Greenland, MD.,have documented all relevant documentation on the behalf of Maximino Greenland, MD,as directed by  Maximino Greenland, MD while in the presence of Maximino Greenland, MD.  This visit occurred during the SARS-CoV-2 public health emergency.  Safety protocols were in place, including screening questions prior to the visit, additional usage of staff PPE, and extensive cleaning of exam room while observing appropriate contact time as indicated for disinfecting solutions.  Subjective:     Patient ID: Rebecca [name] , female    DOB: 1970-09-05 , 49 y.o.   MRN: [id number]   Chief Complaint  Patient presents with  . Annual Exam    HPI  SHE IS HERE TODAY FOR A FULL PHYSICAL EXAM. She would like a referral for a new GYN since her previous GYN retired. She does not have any complaints at this time.    History reviewed. No pertinent past medical history.   Family History  Problem Relation Age of Onset  . Lymphoma Mother   . Prostate cancer Father   . Breast cancer Neg Hx      Current Outpatient Medications:  .  cabergoline (DOSTINEX) 0.5 MG tablet, Take 0.5 mg by mouth 2 (two) times a week., Disp: , Rfl:  .  fluticasone (FLONASE) 50 MCG/ACT nasal spray, Place into both nostrils daily. prn, Disp: , Rfl:  .  Iron-FA-B Cmp-C-Biot-Probiotic (FUSION PLUS) CAPS, Take 1 tablet by mouth daily., Disp: 30 capsule, Rfl: 1 .  Vitamin D, Ergocalciferol, (DRISDOL) 1.25 MG (50000 UNIT) CAPS capsule, One capsule po twice weekly on Tuesdays/Fridays, Disp: 24 capsule, Rfl: 1   No Known Allergies    The patient states she uses none for birth control. Last LMP was No LMP recorded.. Negative for Dysmenorrhea. Negative for: breast discharge, breast lump(s), breast pain and breast self exam. Associated symptoms include abnormal vaginal bleeding. Pertinent negatives include abnormal bleeding (hematology), anxiety, decreased libido, depression, difficulty falling  sleep, dyspareunia, history of infertility, nocturia, sexual dysfunction, sleep disturbances, urinary incontinence, urinary urgency, vaginal discharge and vaginal itching. Diet regular.    . The patient's tobacco use is:  Social History   Tobacco Use  Smoking Status Never Smoker  Smokeless Tobacco Never Used  . She has been exposed to passive smoke. The patient's alcohol use is:  Social History   Substance and Sexual Activity  Alcohol Use Never   Review of Systems  Constitutional: Negative.   HENT: Negative.   Eyes: Negative.   Respiratory: Negative.   Cardiovascular: Negative.   Gastrointestinal: Negative.   Endocrine: Negative.   Genitourinary: Negative.   Musculoskeletal: Negative.   Skin: Negative.   Allergic/Immunologic: Negative.   Neurological: Negative.   Hematological: Negative.        She admits to experiencing pica again. She has h/o iron def. Anemia. She is not sure what may have triggered her sx.   Psychiatric/Behavioral: Negative.      Today's Vitals   03/31/20 1532  BP: 110/60  Pulse: 82  Temp: 98.1 F (36.7 C)  TempSrc: Oral  Weight: 131 lb 9.6 oz (59.7 kg)  Height: 5' 5" (1.651 m)   Body mass index is 21.9 kg/m.   Objective:  Physical Exam Constitutional:      General: She is not in acute distress.    Appearance: Normal appearance. She is well-developed. She is obese.  HENT:     Head: Normocephalic and atraumatic.     Right Ear: Hearing, tympanic  membrane, ear canal and external ear normal. There is no impacted cerumen.     Left Ear: Hearing, tympanic membrane, ear canal and external ear normal. There is no impacted cerumen.     Nose:     Comments: Deferred, masked    Mouth/Throat:     Comments: Deferred, masked Eyes:     General: Lids are normal.     Extraocular Movements: Extraocular movements intact.     Conjunctiva/sclera: Conjunctivae normal.     Pupils: Pupils are equal, round, and reactive to light.     Funduscopic exam:    Right  eye: No papilledema.        Left eye: No papilledema.     Comments: Pale conjunctiva b/l  Neck:     Thyroid: No thyroid mass.     Vascular: No carotid bruit.  Cardiovascular:     Rate and Rhythm: Normal rate and regular rhythm.     Pulses: Normal pulses.     Heart sounds: Normal heart sounds. No murmur heard.   Pulmonary:     Effort: Pulmonary effort is normal.     Breath sounds: Normal breath sounds.  Abdominal:     General: Abdomen is flat. Bowel sounds are normal. There is no distension.     Palpations: Abdomen is soft.     Tenderness: There is no abdominal tenderness.  Genitourinary:    Comments: Deferred.  Musculoskeletal:        General: No swelling. Normal range of motion.     Cervical back: Full passive range of motion without pain, normal range of motion and neck supple.     Right lower leg: No edema.     Left lower leg: No edema.  Skin:    General: Skin is warm and dry.     Capillary Refill: Capillary refill takes less than 2 seconds.  Neurological:     General: No focal deficit present.     Mental Status: She is alert and oriented to person, place, and time.     Cranial Nerves: No cranial nerve deficit.     Sensory: No sensory deficit.  Psychiatric:        Mood and Affect: Mood normal.        Behavior: Behavior normal.        Thought Content: Thought content normal.        Judgment: Judgment normal.         Assessment And Plan:     1. Routine general medical examination at a health care facility Comments: A full exam was performed. Importance of monthly self breast exams was discussed with the patient. I will refer her to GYN as requested. PATIENT IS ADVISED TO GET 30-45 MINUTES REGULAR EXERCISE NO LESS THAN FOUR TO FIVE DAYS PER WEEK - BOTH WEIGHTBEARING EXERCISES AND AEROBIC ARE RECOMMENDED.  PATIENT IS ADVISED TO FOLLOW A HEALTHY DIET WITH AT LEAST SIX FRUITS/VEGGIES PER DAY, DECREASE INTAKE OF RED MEAT, AND TO INCREASE FISH INTAKE TO TWO DAYS PER WEEK.   MEATS/FISH SHOULD NOT BE FRIED, BAKED OR BROILED IS PREFERABLE.  I SUGGEST WEARING SPF 50 SUNSCREEN ON EXPOSED PARTS AND ESPECIALLY WHEN IN THE DIRECT SUNLIGHT FOR AN EXTENDED PERIOD OF TIME.  PLEASE AVOID FAST FOOD RESTAURANTS AND INCREASE YOUR WATER INTAKE.  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 She does have h/o iron deficiency anemia and uterine fibroids.  - Ferritin - Iron and IBC ([id number])  3. Special screening for malignant neoplasm of colon Comments: She agrees to GI referral for CRC screening.   4. 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 I will request records to update her chart.   5. 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 I will request her records.      Patient was given opportunity to ask questions. Patient verbalized understanding of the plan and was able to repeat key elements of the plan. All questions were answered to their satisfaction.   Maximino Greenland, MD   I, Maximino Greenland, MD, have reviewed all documentation for this visit. The documentation on 04/04/20 for the exam, diagnosis, procedures, and orders are all accurate and complete.  THE PATIENT IS ENCOURAGED TO PRACTICE SOCIAL DISTANCING DUE TO THE COVID-19 PANDEMIC.

## 2020-03-31 NOTE — Patient Instructions (Signed)
Health Maintenance, Female Adopting a healthy lifestyle and getting preventive care are important in promoting health and wellness. Ask your health care provider about:  The right schedule for you to have regular tests and exams.  Things you can do on your own to prevent diseases and keep yourself healthy. What should I know about diet, weight, and exercise? Eat a healthy diet   Eat a diet that includes plenty of vegetables, fruits, low-fat dairy products, and lean protein.  Do not eat a lot of foods that are high in solid fats, added sugars, or sodium. Maintain a healthy weight Body mass index (BMI) is used to identify weight problems. It estimates body fat based on height and weight. Your health care provider can help determine your BMI and help you achieve or maintain a healthy weight. Get regular exercise Get regular exercise. This is one of the most important things you can do for your health. Most adults should:  Exercise for at least 150 minutes each week. The exercise should increase your heart rate and make you sweat (moderate-intensity exercise).  Do strengthening exercises at least twice a week. This is in addition to the moderate-intensity exercise.  Spend less time sitting. Even light physical activity can be beneficial. Watch cholesterol and blood lipids Have your blood tested for lipids and cholesterol at 49 years of age, then have this test every 5 years. Have your cholesterol levels checked more often if:  Your lipid or cholesterol levels are high.  You are older than 49 years of age.  You are at high risk for heart disease. What should I know about cancer screening? Depending on your health history and family history, you may need to have cancer screening at various ages. This may include screening for:  Breast cancer.  Cervical cancer.  Colorectal cancer.  Skin cancer.  Lung cancer. What should I know about heart disease, diabetes, and high blood  pressure? Blood pressure and heart disease  High blood pressure causes heart disease and increases the risk of stroke. This is more likely to develop in people who have high blood pressure readings, are of African descent, or are overweight.  Have your blood pressure checked: ? Every 3-5 years if you are 18-39 years of age. ? Every year if you are 40 years old or older. Diabetes Have regular diabetes screenings. This checks your fasting blood sugar level. Have the screening done:  Once every three years after age 40 if you are at a normal weight and have a low risk for diabetes.  More often and at a younger age if you are overweight or have a high risk for diabetes. What should I know about preventing infection? Hepatitis B If you have a higher risk for hepatitis B, you should be screened for this virus. Talk with your health care provider to find out if you are at risk for hepatitis B infection. Hepatitis C Testing is recommended for:  Everyone born from 1945 through 1965.  Anyone with known risk factors for hepatitis C. Sexually transmitted infections (STIs)  Get screened for STIs, including gonorrhea and chlamydia, if: ? You are sexually active and are younger than 49 years of age. ? You are older than 49 years of age and your health care provider tells you that you are at risk for this type of infection. ? Your sexual activity has changed since you were last screened, and you are at increased risk for chlamydia or gonorrhea. Ask your health care provider if   you are at risk.  Ask your health care provider about whether you are at high risk for HIV. Your health care provider may recommend a prescription medicine to help prevent HIV infection. If you choose to take medicine to prevent HIV, you should first get tested for HIV. You should then be tested every 3 months for as long as you are taking the medicine. Pregnancy  If you are about to stop having your period (premenopausal) and  you may become pregnant, seek counseling before you get pregnant.  Take 400 to 800 micrograms (mcg) of folic acid every day if you become pregnant.  Ask for birth control (contraception) if you want to prevent pregnancy. Osteoporosis and menopause Osteoporosis is a disease in which the bones lose minerals and strength with aging. This can result in bone fractures. If you are 65 years old or older, or if you are at risk for osteoporosis and fractures, ask your health care provider if you should:  Be screened for bone loss.  Take a calcium or vitamin D supplement to lower your risk of fractures.  Be given hormone replacement therapy (HRT) to treat symptoms of menopause. Follow these instructions at home: Lifestyle  Do not use any products that contain nicotine or tobacco, such as cigarettes, e-cigarettes, and chewing tobacco. If you need help quitting, ask your health care provider.  Do not use street drugs.  Do not share needles.  Ask your health care provider for help if you need support or information about quitting drugs. Alcohol use  Do not drink alcohol if: ? Your health care provider tells you not to drink. ? You are pregnant, may be pregnant, or are planning to become pregnant.  If you drink alcohol: ? Limit how much you use to 0-1 drink a day. ? Limit intake if you are breastfeeding.  Be aware of how much alcohol is in your drink. In the [place] one drink equals one 12 oz bottle of beer (355 mL), one 5 oz glass of wine (148 mL), or one 1 oz glass of hard liquor (44 mL). General instructions  Schedule regular health, dental, and eye exams.  Stay current with your vaccines.  Tell your health care provider if: ? You often feel depressed. ? You have ever been abused or do not feel safe at home. Summary  Adopting a healthy lifestyle and getting preventive care are important in promoting health and wellness.  Follow your health care provider's instructions about healthy  diet, exercising, and getting tested or screened for diseases.  Follow your health care provider's instructions on monitoring your cholesterol and blood pressure. This information is not intended to replace advice given to you by your health care provider. Make sure you discuss any questions you have with your health care provider. Document Revised: 07/09/2018 Document Reviewed: 07/09/2018 Elsevier Patient Education  2020 Elsevier Inc.  

## 2020-04-01 LAB — CMP14+EGFR
ALT: 7 IU/L (ref 0–32)
AST: 12 IU/L (ref 0–40)
Albumin/Globulin Ratio: 2.1 (ref 1.2–2.2)
Albumin: 4.6 g/dL (ref 3.8–4.8)
Alkaline Phosphatase: 55 IU/L (ref 48–121)
BUN/Creatinine Ratio: 15 (ref 9–23)
BUN: 11 mg/dL (ref 6–24)
Bilirubin Total: 0.2 mg/dL (ref 0.0–1.2)
CO2: 22 mmol/L (ref 20–29)
Calcium: 9.2 mg/dL (ref 8.7–10.2)
Chloride: 104 mmol/L (ref 96–106)
Creatinine, Ser: 0.75 mg/dL (ref 0.57–1.00)
GFR calc Af Amer: 108 mL/min/{1.73_m2} (ref >59)
GFR calc non Af Amer: 94 mL/min/{1.73_m2} (ref >59)
Globulin, Total: 2.2 g/dL (ref 1.5–4.5)
Glucose: 88 mg/dL (ref 65–99)
Potassium: 3.9 mmol/L (ref 3.5–5.2)
Sodium: 138 mmol/L (ref 134–144)
Total Protein: 6.8 g/dL (ref 6.0–8.5)

## 2020-04-01 LAB — CBC
Hematocrit: 28.6 % — ABNORMAL LOW (ref 34.0–46.6)
Hemoglobin: 8.6 g/dL — ABNORMAL LOW (ref 11.1–15.9)
MCH: 21.6 pg — ABNORMAL LOW (ref 26.6–33.0)
MCHC: 30.1 g/dL — ABNORMAL LOW (ref 31.5–35.7)
MCV: 72 fL — ABNORMAL LOW (ref 79–97)
Platelets: 371 10*3/uL (ref 150–450)
RBC: 3.98 x10E6/uL (ref 3.77–5.28)
RDW: 15.3 % (ref 11.7–15.4)
WBC: 6.9 10*3/uL (ref 3.4–10.8)

## 2020-04-01 LAB — HEPATITIS C ANTIBODY: Hep C Virus Ab: <0.1 s/co ratio (ref 0.0–0.9)

## 2020-04-01 LAB — VITAMIN D 25 HYDROXY (VIT D DEFICIENCY, FRACTURES): Vit D, 25-Hydroxy: 24.9 ng/mL — ABNORMAL LOW (ref 30.0–100.0)

## 2020-04-01 LAB — LIPID PANEL
Chol/HDL Ratio: 2.4 ratio (ref 0.0–4.4)
Cholesterol, Total: 154 mg/dL (ref 100–199)
HDL: 65 mg/dL (ref >39)
LDL Chol Calc (NIH): 77 mg/dL (ref 0–99)
Triglycerides: 57 mg/dL (ref 0–149)
VLDL Cholesterol Cal: 12 mg/dL (ref 5–40)

## 2020-04-02 ENCOUNTER — Other Ambulatory Visit: Payer: Self-pay | Admitting: Internal Medicine

## 2020-04-02 MED ORDER — FUSION PLUS PO CAPS
1.0000 | ORAL_CAPSULE | Freq: Every day | ORAL | 1 refills | Status: DC
Start: 2020-04-02 — End: 2020-07-12

## 2020-04-02 MED ORDER — VITAMIN D (ERGOCALCIFEROL) 1.25 MG (50000 UNIT) PO CAPS: ORAL_CAPSULE | ORAL | 1 refills | Status: DC

## 2020-04-11 ENCOUNTER — Encounter: Payer: Self-pay | Admitting: Internal Medicine

## 2020-05-23 ENCOUNTER — Encounter: Payer: Self-pay | Admitting: Internal Medicine

## 2020-05-26 LAB — HM COLONOSCOPY

## 2020-06-01 ENCOUNTER — Encounter: Payer: Self-pay | Admitting: Internal Medicine

## 2020-06-30 LAB — HM PAP SMEAR

## 2020-07-01 ENCOUNTER — Encounter: Payer: Self-pay | Admitting: Internal Medicine

## 2020-07-02 ENCOUNTER — Other Ambulatory Visit: Payer: Self-pay

## 2020-07-02 MED ORDER — FLUTICASONE PROPIONATE 50 MCG/ACT NA SUSP
1.0000 | Freq: Every day | NASAL | 1 refills | Status: DC
Start: 2020-07-02 — End: 2020-07-26

## 2020-07-05 ENCOUNTER — Encounter: Payer: Self-pay | Admitting: Internal Medicine

## 2020-07-11 ENCOUNTER — Other Ambulatory Visit: Payer: Self-pay | Admitting: Internal Medicine

## 2020-07-26 ENCOUNTER — Other Ambulatory Visit: Payer: Self-pay | Admitting: Internal Medicine

## 2020-08-08 ENCOUNTER — Encounter: Payer: Self-pay | Admitting: Internal Medicine

## 2020-08-20 ENCOUNTER — Other Ambulatory Visit: Payer: Self-pay | Admitting: Internal Medicine

## 2020-09-04 ENCOUNTER — Other Ambulatory Visit: Payer: Self-pay | Admitting: Internal Medicine

## 2020-09-15 ENCOUNTER — Other Ambulatory Visit: Payer: Self-pay | Admitting: Internal Medicine

## 2020-10-06 ENCOUNTER — Encounter: Payer: Self-pay | Admitting: Internal Medicine

## 2020-10-21 ENCOUNTER — Encounter: Payer: Self-pay | Admitting: Internal Medicine

## 2020-11-03 ENCOUNTER — Other Ambulatory Visit: Payer: Self-pay | Admitting: Internal Medicine

## 2020-11-03 DIAGNOSIS — Z1231 Encounter for screening mammogram for malignant neoplasm of breast: Secondary | ICD-10-CM

## 2020-11-09 ENCOUNTER — Other Ambulatory Visit: Payer: Self-pay | Admitting: Internal Medicine

## 2020-11-15 ENCOUNTER — Other Ambulatory Visit: Payer: Self-pay | Admitting: Internal Medicine

## 2020-11-25 DIAGNOSIS — Z1231 Encounter for screening mammogram for malignant neoplasm of breast: Secondary | ICD-10-CM

## 2021-01-13 ENCOUNTER — Other Ambulatory Visit: Payer: Self-pay

## 2021-01-13 ENCOUNTER — Ambulatory Visit
Admission: RE | Admit: 2021-01-13 | Discharge: 2021-01-13 | Disposition: A | Discharge status: Home / Self Care | Payer: BC Managed Care – PPO | Source: Ambulatory Visit | Attending: Internal Medicine | Admitting: Internal Medicine

## 2021-01-13 DIAGNOSIS — Z1231 Encounter for screening mammogram for malignant neoplasm of breast: Secondary | ICD-10-CM

## 2021-01-15 ENCOUNTER — Other Ambulatory Visit: Payer: Self-pay | Admitting: Internal Medicine

## 2021-01-16 ENCOUNTER — Encounter: Payer: Self-pay | Admitting: Internal Medicine

## 2021-01-17 ENCOUNTER — Other Ambulatory Visit: Payer: Self-pay

## 2021-01-17 ENCOUNTER — Encounter: Payer: Self-pay | Admitting: Nurse Practitioner

## 2021-01-17 ENCOUNTER — Ambulatory Visit: Payer: BC Managed Care – PPO | Admitting: Nurse Practitioner

## 2021-01-17 VITALS — BP 120/74 | HR 87 | Temp 98.2°F | Ht 65.0 in | Wt 128.6 lb

## 2021-01-17 DIAGNOSIS — H9311 Tinnitus, right ear: Secondary | ICD-10-CM

## 2021-01-17 NOTE — Progress Notes (Signed)
I,Tianna Badgett,acting as a Education administrator for [employer], NP.,have documented all relevant documentation on the behalf of [employer], NP,as directed by  Bary Castilla, NP while in the presence of Bary Castilla, NP.  This visit occurred during the SARS-CoV-2 public health emergency.  Safety protocols were in place, including screening questions prior to the visit, additional usage of staff PPE, and extensive cleaning of exam room while observing appropriate contact time as indicated for disinfecting solutions.  Subjective:     Patient ID: Rebecca [name] , female    DOB: 1971/07/01 , 50 y.o.   MRN: [id number]   Chief Complaint  Patient presents with   Tinnitus    HPI  Patient is here for ringing in her ears. This started about a month ago but has persisted and intensified. She has had whistling sound for the past couple of months. Loud nose does not bother her. No other concerns today.     No past medical history on file.   Family History  Problem Relation Age of Onset   Lymphoma Mother    Prostate cancer Father    Breast cancer Neg Hx      Current Outpatient Medications:    cabergoline (DOSTINEX) 0.5 MG tablet, Take 0.5 mg by mouth 2 (two) times a week., Disp: , Rfl:    fluticasone (FLONASE) 50 MCG/ACT nasal spray, PLACE 1 SPRAY INTO BOTH NOSTRILS EVERY DAY AS NEEDED., Disp: 16 mL, Rfl: 1   Iron-FA-B Cmp-C-Biot-Probiotic (FUSION PLUS) CAPS, TAKE 1 CAPSULE BY MOUTH EVERY DAY, Disp: 30 capsule, Rfl: 1   Vitamin D, Ergocalciferol, (DRISDOL) 1.25 MG (50000 UNIT) CAPS capsule, ONE CAPSULE BY MOUTH TWICE WEEKLY ON TUESDAYS/FRIDAYS, Disp: 24 capsule, Rfl: 1   No Known Allergies   Review of Systems  Constitutional:  Negative for chills, fatigue and fever.  HENT:  Positive for tinnitus. Negative for congestion, ear discharge, hearing loss, sinus pressure, sinus pain, sneezing and sore throat.        Ringing/discomfort in right ear   Eyes:  Negative for visual  disturbance.  Respiratory:  Negative for cough, chest tightness and wheezing.   Cardiovascular:  Negative for chest pain and palpitations.  Gastrointestinal:  Negative for constipation and diarrhea.  Musculoskeletal:  Negative for myalgias.  Neurological:  Negative for dizziness, weakness, numbness and headaches.    Today's Vitals   01/17/21 1624  BP: 120/74  Pulse: 87  Temp: 98.2 F (36.8 C)  TempSrc: Oral  Weight: 128 lb 9.6 oz (58.3 kg)  Height: 5\' 5"  (1.651 m)   Body mass index is 21.4 kg/m.   Objective:  Physical Exam Constitutional:      Appearance: Normal appearance.  HENT:     Head: Normocephalic and atraumatic.     Right Ear: Tympanic membrane normal. There is impacted cerumen.     Left Ear: Tympanic membrane normal. There is no impacted cerumen.     Ears:     Comments: Significant ear cerumen built up in right ear.     Nose: No rhinorrhea.  Cardiovascular:     Rate and Rhythm: Normal rate and regular rhythm.     Pulses: Normal pulses.     Heart sounds: Normal heart sounds. No murmur heard. Pulmonary:     Effort: Pulmonary effort is normal. No respiratory distress.     Breath sounds: No wheezing.  Skin:    General: Skin is warm and dry.     Capillary Refill: Capillary refill takes less than 2 seconds.  Neurological:  Mental Status: She is alert and oriented to person, place, and time.        Assessment And Plan:     1. Tinnitus of right ear  -Cerumen is right ear  -Flushed it out with curette in right ear only -Advised patient if symptoms do not get better ENT referral would be considered.  -Advised patient to follow up if symptoms get worse.   The patient was encouraged to call or send a message through [place] for any questions or concerns.   Follow up: if symptoms persist or do not get better.   Patient was given opportunity to ask questions. Patient verbalized understanding of the plan and was able to repeat key elements of the plan. All  questions were answered to their satisfaction.  Raman Geddy [place], DNP   I, Raman Cheralyn Oliver have reviewed all documentation for this visit. The documentation on 01/18/21 for the exam, diagnosis, procedures, and orders are all accurate and complete.   IF YOU HAVE BEEN REFERRED TO A SPECIALIST, IT MAY TAKE 1-2 WEEKS TO SCHEDULE/PROCESS THE REFERRAL. IF YOU HAVE NOT HEARD FROM US/SPECIALIST IN TWO WEEKS, PLEASE GIVE [place] A CALL AT [phone].   THE PATIENT IS ENCOURAGED TO PRACTICE SOCIAL DISTANCING DUE TO THE COVID-19 PANDEMIC.

## 2021-01-26 ENCOUNTER — Other Ambulatory Visit: Payer: Self-pay | Admitting: Nurse Practitioner

## 2021-01-26 DIAGNOSIS — H9311 Tinnitus, right ear: Secondary | ICD-10-CM

## 2021-02-24 ENCOUNTER — Other Ambulatory Visit: Payer: Self-pay | Admitting: Internal Medicine

## 2021-03-08 ENCOUNTER — Ambulatory Visit (INDEPENDENT_AMBULATORY_CARE_PROVIDER_SITE_OTHER): Payer: BC Managed Care – PPO | Admitting: Otolaryngology

## 2021-03-19 ENCOUNTER — Other Ambulatory Visit: Payer: Self-pay | Admitting: Internal Medicine

## 2021-03-21 ENCOUNTER — Other Ambulatory Visit: Payer: Self-pay | Admitting: Internal Medicine

## 2021-03-21 DIAGNOSIS — D352 Benign neoplasm of pituitary gland: Secondary | ICD-10-CM

## 2021-03-21 DIAGNOSIS — E221 Hyperprolactinemia: Secondary | ICD-10-CM

## 2021-03-27 ENCOUNTER — Ambulatory Visit (INDEPENDENT_AMBULATORY_CARE_PROVIDER_SITE_OTHER): Payer: BC Managed Care – PPO | Admitting: Otolaryngology

## 2021-03-27 ENCOUNTER — Other Ambulatory Visit: Payer: Self-pay

## 2021-03-27 DIAGNOSIS — H9311 Tinnitus, right ear: Secondary | ICD-10-CM | POA: Diagnosis not present

## 2021-03-27 NOTE — Progress Notes (Signed)
HPI: Rebecca [name] is a 50 y.o. female who presents is referred by her PCP for evaluation of right ear tinnitus.  This began during the winter of last year and was intermittent initially but is now more constant.  She denies any loud noise exposure.  Denies any trauma to the ear.  Has not really noticed any hearing problems..  No past medical history on file. No past surgical history on file. Social History   Socioeconomic History   Marital status: Single    Spouse name: Not on file   Number of children: Not on file   Years of education: Not on file   Highest education level: Not on file  Occupational History   Not on file  Tobacco Use   Smoking status: Never   Smokeless tobacco: Never  Vaping Use   Vaping Use: Never used  Substance and Sexual Activity   Alcohol use: Never   Drug use: Never   Sexual activity: Not Currently  Other Topics Concern   Not on file  Social History Narrative   Not on file   Social Determinants of Health   Financial Resource Strain: Not on file  Food Insecurity: Not on file  Transportation Needs: Not on file  Physical Activity: Not on file  Stress: Not on file  Social Connections: Not on file   Family History  Problem Relation Age of Onset   Lymphoma Mother    Prostate cancer Father    Breast cancer Neg Hx    No Known Allergies Prior to Admission medications   Medication Sig Start Date End Date Taking? Authorizing Provider  cabergoline (DOSTINEX) 0.5 MG tablet Take 0.5 mg by mouth 2 (two) times a week.    [provider]  fluticasone (FLONASE) 50 MCG/ACT nasal spray PLACE 1 SPRAY INTO BOTH NOSTRILS EVERY DAY AS NEEDED. 11/15/20   Glendale Chard, MD  Iron-FA-B Cmp-C-Biot-Probiotic (FUSION PLUS) CAPS TAKE 1 CAPSULE BY MOUTH EVERY DAY 03/20/21   Glendale Chard, MD  Vitamin D, Ergocalciferol, (DRISDOL) 1.25 MG (50000 UNIT) CAPS capsule ONE CAPSULE BY MOUTH TWICE WEEKLY ON TUESDAYS/FRIDAYS 02/24/21   Glendale Chard, MD     Positive  ROS: Otherwise negative  All other systems have been reviewed and were otherwise negative with the exception of those mentioned in the HPI and as above.  Physical Exam: Constitutional: Alert, well-appearing, no acute distress Ears: External ears without lesions or tenderness. Ear canals are clear bilaterally with intact, clear TMs bilaterally. Nasal: External nose without lesions. Septum with minimal deformity.. Clear nasal passages Oral: Lips and gums without lesions. Tongue and palate mucosa without lesions. Posterior oropharynx clear. Neck: No palpable adenopathy or masses Respiratory: Breathing comfortably  Skin: No facial/neck lesions or rash noted.  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  She had type A tympanograms bilaterally and SRT's were 5 dB bilaterally.  Procedures  Assessment: Right ear tinnitus questionable etiology. Normal exam and normal hearing evaluation.  Plan: Reviewed with patient concerning audiologic testing which demonstrated normal hearing in both ears with type A tympanograms. Discussed whether concerning limited treatment options for tinnitus. Suggest using masking noise when the tinnitus is bothersome.   Radene Journey, MD   CC:

## 2021-03-28 ENCOUNTER — Encounter (INDEPENDENT_AMBULATORY_CARE_PROVIDER_SITE_OTHER): Payer: Self-pay

## 2021-04-06 ENCOUNTER — Encounter: Payer: Self-pay | Admitting: Internal Medicine

## 2021-04-06 ENCOUNTER — Ambulatory Visit (INDEPENDENT_AMBULATORY_CARE_PROVIDER_SITE_OTHER): Payer: BC Managed Care – PPO | Admitting: Internal Medicine

## 2021-04-06 ENCOUNTER — Other Ambulatory Visit: Payer: Self-pay

## 2021-04-06 VITALS — BP 104/66 | HR 86 | Temp 98.3°F | Ht 65.8 in | Wt 128.4 lb

## 2021-04-06 DIAGNOSIS — Z0000 Encounter for general adult medical examination without abnormal findings: Secondary | ICD-10-CM

## 2021-04-06 DIAGNOSIS — Z23 Encounter for immunization: Secondary | ICD-10-CM | POA: Diagnosis not present

## 2021-04-06 DIAGNOSIS — D500 Iron deficiency anemia secondary to blood loss (chronic): Secondary | ICD-10-CM | POA: Diagnosis not present

## 2021-04-06 NOTE — Progress Notes (Signed)
I,Katawbba Wiggins,acting as a Education administrator for Maximino Greenland, MD.,have documented all relevant documentation on the behalf of Maximino Greenland, MD,as directed by  Maximino Greenland, MD while in the presence of Maximino Greenland, MD.  This visit occurred during the SARS-CoV-2 public health emergency.  Safety protocols were in place, including screening questions prior to the visit, additional usage of staff PPE, and extensive cleaning of exam room while observing appropriate contact time as indicated for disinfecting solutions.  Subjective:     Patient ID: Rebecca [name] , female    DOB: May 01, 1971 , 50 y.o.   MRN: [id number]   Chief Complaint  Patient presents with   Annual Exam    HPI  SHE IS HERE TODAY FOR A FULL PHYSICAL EXAM. THE PATIENT IS FOLLOWED BY DR.ELMIRA POWELL, LAST PAP WAS [place]. 2021.    Past Medical History:  Diagnosis Date   Iron deficiency anemia    Leiomyoma      Family History  Problem Relation Age of Onset   Lymphoma Mother    Prostate cancer Father    Breast cancer Neg Hx      Current Outpatient Medications:    cabergoline (DOSTINEX) 0.5 MG tablet, Take 0.5 mg by mouth 2 (two) times a week., Disp: , Rfl:    fluticasone (FLONASE) 50 MCG/ACT nasal spray, PLACE 1 SPRAY INTO BOTH NOSTRILS EVERY DAY AS NEEDED., Disp: 16 mL, Rfl: 1   Iron-FA-B Cmp-C-Biot-Probiotic (FUSION PLUS) CAPS, TAKE 1 CAPSULE BY MOUTH EVERY DAY, Disp: 30 capsule, Rfl: 1   No Known Allergies    The patient states she uses none for birth control. Last LMP was Patient's last menstrual period was 03/17/2021.. Negative for Dysmenorrhea. Negative for: breast discharge, breast lump(s), breast pain and breast self exam. Associated symptoms include abnormal vaginal bleeding. Pertinent negatives include abnormal bleeding (hematology), anxiety, decreased libido, depression, difficulty falling sleep, dyspareunia, history of infertility, nocturia, sexual dysfunction, sleep disturbances, urinary incontinence,  urinary urgency, vaginal discharge and vaginal itching. Diet regular.    . The patient's tobacco use is:  Social History   Tobacco Use  Smoking Status Never  Smokeless Tobacco Never  . She has been exposed to passive smoke. The patient's alcohol use is:  Social History   Substance and Sexual Activity  Alcohol Use Never   Review of Systems  Constitutional: Negative.   HENT: Negative.    Eyes: Negative.   Respiratory: Negative.    Cardiovascular: Negative.   Gastrointestinal: Negative.   Endocrine: Negative.   Genitourinary: Negative.   Musculoskeletal: Negative.   Skin: Negative.   Allergic/Immunologic: Negative.   Neurological: Negative.   Hematological: Negative.   Psychiatric/Behavioral: Negative.      Today's Vitals   04/06/21 1429  BP: 104/66  Pulse: 86  Temp: 98.3 F (36.8 C)  TempSrc: Oral  Weight: 128 lb 6.4 oz (58.2 kg)  Height: 5' 5.8" (1.671 m)   Body mass index is 20.85 kg/m.  Wt Readings from Last 3 Encounters:  04/06/21 128 lb 6.4 oz (58.2 kg)  01/17/21 128 lb 9.6 oz (58.3 kg)  03/31/20 131 lb 9.6 oz (59.7 kg)    BP Readings from Last 3 Encounters:  04/06/21 104/66  01/17/21 120/74  03/31/20 110/60    Objective:  Physical Exam Vitals and nursing note reviewed.  Constitutional:      Appearance: Normal appearance.  HENT:     Head: Normocephalic and atraumatic.     Right Ear: Tympanic membrane, ear canal and external  ear normal.     Left Ear: Tympanic membrane, ear canal and external ear normal.     Nose:     Comments: Masked     Mouth/Throat:     Comments: Masked  Eyes:     Extraocular Movements: Extraocular movements intact.     Conjunctiva/sclera: Conjunctivae normal.     Pupils: Pupils are equal, round, and reactive to light.  Cardiovascular:     Rate and Rhythm: Normal rate and regular rhythm.     Pulses: Normal pulses.     Heart sounds: Normal heart sounds.  Pulmonary:     Effort: Pulmonary effort is normal.     Breath sounds:  Normal breath sounds.  Chest:  Breasts:    Tanner Score is 5.     Right: Normal.     Left: Normal.  Abdominal:     General: Abdomen is flat. Bowel sounds are normal.     Palpations: Abdomen is soft.  Genitourinary:    Comments: deferred Musculoskeletal:        General: Normal range of motion.     Cervical back: Normal range of motion and neck supple.  Skin:    General: Skin is warm and dry.  Neurological:     General: No focal deficit present.     Mental Status: She is alert and oriented to person, place, and time.  Psychiatric:        Mood and Affect: Mood normal.        Behavior: Behavior normal.        Assessment And Plan:     1. Routine general medical examination at a health care facility Comments: A full exam was performed. Importance of monthly self breast exams was discussed with the patient.  PATIENT IS ADVISED TO GET 30-45 MINUTES REGULAR EXERCISE NO LESS THAN FOUR TO FIVE DAYS PER WEEK - BOTH WEIGHTBEARING EXERCISES AND AEROBIC ARE RECOMMENDED.  PATIENT IS ADVISED TO FOLLOW A HEALTHY DIET WITH AT LEAST SIX FRUITS/VEGGIES PER DAY, DECREASE INTAKE OF RED MEAT, AND TO INCREASE FISH INTAKE TO TWO DAYS PER WEEK.  MEATS/FISH SHOULD NOT BE FRIED, BAKED OR BROILED IS PREFERABLE.  IT IS ALSO IMPORTANT TO CUT BACK ON YOUR SUGAR INTAKE. PLEASE AVOID ANYTHING WITH ADDED SUGAR, CORN SYRUP OR OTHER SWEETENERS. IF YOU MUST USE A SWEETENER, YOU CAN TRY STEVIA. IT IS ALSO IMPORTANT TO AVOID ARTIFICIALLY SWEETENERS AND DIET BEVERAGES. LASTLY, I SUGGEST WEARING SPF 50 SUNSCREEN ON EXPOSED PARTS AND ESPECIALLY WHEN IN THE DIRECT SUNLIGHT FOR AN EXTENDED PERIOD OF TIME.  PLEASE AVOID FAST FOOD RESTAURANTS AND INCREASE YOUR WATER INTAKE.  - CBC - CMP14+EGFR - Lipid panel  2. Iron deficiency anemia due to chronic blood loss Comments: Likely due to menorrhagia due to uterine leiomyoma. I will check iron studies and make recommendations once reviewed.  - Iron and IBC ([id number]) -  Ferritin  3. Need for vaccination Comments: She was given flu vaccine to update immunization history.   Patient was given opportunity to ask questions. Patient verbalized understanding of the plan and was able to repeat key elements of the plan. All questions were answered to their satisfaction.   I, Maximino Greenland, MD, have reviewed all documentation for this visit. The documentation on 04/08/21 for the exam, diagnosis, procedures, and orders are all accurate and complete.   THE PATIENT IS ENCOURAGED TO PRACTICE SOCIAL DISTANCING DUE TO THE COVID-19 PANDEMIC.

## 2021-04-06 NOTE — Patient Instructions (Addendum)
Increase intake of cruciferous veggies Ginger/turmeric tea Increase exercise These things will help improve liver health AVOID soy products Choose grass fed beef and free range chicken  You CAN do this!!!!  Health Maintenance, Female Adopting a healthy lifestyle and getting preventive care are important in promoting health and wellness. Ask your health care provider about: The right schedule for you to have regular tests and exams. Things you can do on your own to prevent diseases and keep yourself healthy. What should I know about diet, weight, and exercise? Eat a healthy diet  Eat a diet that includes plenty of vegetables, fruits, low-fat dairy products, and lean protein. Do not eat a lot of foods that are high in solid fats, added sugars, or sodium. Maintain a healthy weight Body mass index (BMI) is used to identify weight problems. It estimates body fat based on height and weight. Your health care provider can help determine your BMI and help you achieve or maintain a healthy weight. Get regular exercise Get regular exercise. This is one of the most important things you can do for your health. Most adults should: Exercise for at least 150 minutes each week. The exercise should increase your heart rate and make you sweat (moderate-intensity exercise). Do strengthening exercises at least twice a week. This is in addition to the moderate-intensity exercise. Spend less time sitting. Even light physical activity can be beneficial. Watch cholesterol and blood lipids Have your blood tested for lipids and cholesterol at 50 years of age, then have this test every 5 years. Have your cholesterol levels checked more often if: Your lipid or cholesterol levels are high. You are older than 50 years of age. You are at high risk for heart disease. What should I know about cancer screening? Depending on your health history and family history, you may need to have cancer screening at various ages.  This may include screening for: Breast cancer. Cervical cancer. Colorectal cancer. Skin cancer. Lung cancer. What should I know about heart disease, diabetes, and high blood pressure? Blood pressure and heart disease High blood pressure causes heart disease and increases the risk of stroke. This is more likely to develop in people who have high blood pressure readings, are of African descent, or are overweight. Have your blood pressure checked: Every 3-5 years if you are 41-61 years of age. Every year if you are 60 years old or older. Diabetes Have regular diabetes screenings. This checks your fasting blood sugar level. Have the screening done: Once every three years after age 67 if you are at a normal weight and have a low risk for diabetes. More often and at a younger age if you are overweight or have a high risk for diabetes. What should I know about preventing infection? Hepatitis B If you have a higher risk for hepatitis B, you should be screened for this virus. Talk with your health care provider to find out if you are at risk for hepatitis B infection. Hepatitis C Testing is recommended for: Everyone born from [birth year] through 1965. Anyone with known risk factors for hepatitis C. Sexually transmitted infections (STIs) Get screened for STIs, including gonorrhea and chlamydia, if: You are sexually active and are younger than 50 years of age. You are older than 50 years of age and your health care provider tells you that you are at risk for this type of infection. Your sexual activity has changed since you were last screened, and you are at increased risk for chlamydia or gonorrhea.  Ask your health care provider if you are at risk. Ask your health care provider about whether you are at high risk for HIV. Your health care provider may recommend a prescription medicine to help prevent HIV infection. If you choose to take medicine to prevent HIV, you should first get tested for HIV. You  should then be tested every 3 months for as long as you are taking the medicine. Pregnancy If you are about to stop having your period (premenopausal) and you may become pregnant, seek counseling before you get pregnant. Take 400 to 800 micrograms (mcg) of folic acid every day if you become pregnant. Ask for birth control (contraception) if you want to prevent pregnancy. Osteoporosis and menopause Osteoporosis is a disease in which the bones lose minerals and strength with aging. This can result in bone fractures. If you are 77 years old or older, or if you are at risk for osteoporosis and fractures, ask your health care provider if you should: Be screened for bone loss. Take a calcium or vitamin D supplement to lower your risk of fractures. Be given hormone replacement therapy (HRT) to treat symptoms of menopause. Follow these instructions at home: Lifestyle Do not use any products that contain nicotine or tobacco, such as cigarettes, e-cigarettes, and chewing tobacco. If you need help quitting, ask your health care provider. Do not use street drugs. Do not share needles. Ask your health care provider for help if you need support or information about quitting drugs. Alcohol use Do not drink alcohol if: Your health care provider tells you not to drink. You are pregnant, may be pregnant, or are planning to become pregnant. If you drink alcohol: Limit how much you use to 0-1 drink a day. Limit intake if you are breastfeeding. Be aware of how much alcohol is in your drink. In the [place] one drink equals one 12 oz bottle of beer (355 mL), one 5 oz glass of wine (148 mL), or one 1 oz glass of hard liquor (44 mL). General instructions Schedule regular health, dental, and eye exams. Stay current with your vaccines. Tell your health care provider if: You often feel depressed. You have ever been abused or do not feel safe at home. Summary Adopting a healthy lifestyle and getting preventive care  are important in promoting health and wellness. Follow your health care provider's instructions about healthy diet, exercising, and getting tested or screened for diseases. Follow your health care provider's instructions on monitoring your cholesterol and blood pressure. This information is not intended to replace advice given to you by your health care provider. Make sure you discuss any questions you have with your health care provider. Document Revised: 09/23/2020 Document Reviewed: 07/09/2018 Elsevier Patient Education  2022 [employer].

## 2021-04-07 LAB — CMP14+EGFR
ALT: 10 IU/L (ref 0–32)
AST: 17 IU/L (ref 0–40)
Albumin/Globulin Ratio: 2.4 — ABNORMAL HIGH (ref 1.2–2.2)
Albumin: 4.8 g/dL (ref 3.8–4.8)
Alkaline Phosphatase: 55 IU/L (ref 44–121)
BUN/Creatinine Ratio: 16 (ref 9–23)
BUN: 12 mg/dL (ref 6–24)
Bilirubin Total: 0.4 mg/dL (ref 0.0–1.2)
CO2: 24 mmol/L (ref 20–29)
Calcium: 10.4 mg/dL — ABNORMAL HIGH (ref 8.7–10.2)
Chloride: 101 mmol/L (ref 96–106)
Creatinine, Ser: 0.76 mg/dL (ref 0.57–1.00)
Globulin, Total: 2 g/dL (ref 1.5–4.5)
Glucose: 80 mg/dL (ref 65–99)
Potassium: 4.5 mmol/L (ref 3.5–5.2)
Sodium: 140 mmol/L (ref 134–144)
Total Protein: 6.8 g/dL (ref 6.0–8.5)
eGFR: 95 mL/min/{1.73_m2} (ref >59)

## 2021-04-07 LAB — LIPID PANEL
Chol/HDL Ratio: 2.7 ratio (ref 0.0–4.4)
Cholesterol, Total: 151 mg/dL (ref 100–199)
HDL: 56 mg/dL (ref >39)
LDL Chol Calc (NIH): 82 mg/dL (ref 0–99)
Triglycerides: 65 mg/dL (ref 0–149)
VLDL Cholesterol Cal: 13 mg/dL (ref 5–40)

## 2021-04-07 LAB — CBC
Hematocrit: 35.6 % (ref 34.0–46.6)
Hemoglobin: 12.3 g/dL (ref 11.1–15.9)
MCH: 28.9 pg (ref 26.6–33.0)
MCHC: 34.6 g/dL (ref 31.5–35.7)
MCV: 84 fL (ref 79–97)
Platelets: 329 10*3/uL (ref 150–450)
RBC: 4.26 x10E6/uL (ref 3.77–5.28)
RDW: 11.8 % (ref 11.7–15.4)
WBC: 7.6 10*3/uL (ref 3.4–10.8)

## 2021-04-07 LAB — IRON AND TIBC
Iron Saturation: 28 % (ref 15–55)
Iron: 96 ug/dL (ref 27–159)
Total Iron Binding Capacity: 346 ug/dL (ref 250–450)
UIBC: 250 ug/dL (ref 131–425)

## 2021-04-07 LAB — FERRITIN: Ferritin: 28 ng/mL (ref 15–150)

## 2021-04-08 ENCOUNTER — Encounter: Payer: Self-pay | Admitting: Internal Medicine

## 2021-04-19 ENCOUNTER — Other Ambulatory Visit: Payer: Self-pay

## 2021-04-19 ENCOUNTER — Ambulatory Visit
Admission: RE | Admit: 2021-04-19 | Discharge: 2021-04-19 | Disposition: A | Discharge status: Home / Self Care | Payer: BC Managed Care – PPO | Source: Ambulatory Visit | Attending: Internal Medicine | Admitting: Internal Medicine

## 2021-04-19 DIAGNOSIS — D352 Benign neoplasm of pituitary gland: Secondary | ICD-10-CM

## 2021-04-19 DIAGNOSIS — E221 Hyperprolactinemia: Secondary | ICD-10-CM

## 2021-04-19 MED ORDER — GADOBENATE DIMEGLUMINE 529 MG/ML IV SOLN
6.0000 mL | Freq: Once | INTRAVENOUS | Status: AC | PRN
  Administered 2021-04-19: 6 mL via INTRAVENOUS

## 2021-04-24 ENCOUNTER — Encounter: Payer: Self-pay | Admitting: Internal Medicine

## 2021-06-05 ENCOUNTER — Other Ambulatory Visit: Payer: Self-pay | Admitting: Internal Medicine

## 2021-07-11 ENCOUNTER — Other Ambulatory Visit: Payer: Self-pay | Admitting: Obstetrics and Gynecology

## 2021-07-11 DIAGNOSIS — E049 Nontoxic goiter, unspecified: Secondary | ICD-10-CM

## 2021-07-13 ENCOUNTER — Ambulatory Visit (HOSPITAL_COMMUNITY): Payer: BC Managed Care – PPO

## 2021-07-14 ENCOUNTER — Ambulatory Visit
Admission: RE | Admit: 2021-07-14 | Discharge: 2021-07-14 | Disposition: A | Discharge status: Home / Self Care | Payer: BC Managed Care – PPO | Source: Ambulatory Visit | Attending: Obstetrics and Gynecology | Admitting: Obstetrics and Gynecology

## 2021-07-14 DIAGNOSIS — E049 Nontoxic goiter, unspecified: Secondary | ICD-10-CM

## 2021-08-11 ENCOUNTER — Other Ambulatory Visit: Payer: Self-pay | Admitting: Obstetrics and Gynecology

## 2021-08-11 DIAGNOSIS — E041 Nontoxic single thyroid nodule: Secondary | ICD-10-CM

## 2021-08-14 ENCOUNTER — Encounter: Payer: Self-pay | Admitting: Internal Medicine

## 2021-08-14 ENCOUNTER — Other Ambulatory Visit: Payer: Self-pay | Admitting: Obstetrics and Gynecology

## 2021-08-15 ENCOUNTER — Other Ambulatory Visit: Payer: Self-pay | Admitting: Obstetrics and Gynecology

## 2021-08-21 NOTE — Progress Notes (Addendum)
Surgical Instructions    Your procedure is scheduled on Monday January 30th.  Report to [facility] Main Entrance "A" at 1200 P.M., then check in with the Admitting office.  Call this number if you have problems the morning of surgery:  [phone]   If you have any questions prior to your surgery date call [phone]: Open Monday-Friday 8am-4pm    Remember:  Do not eat after midnight the night before your surgery  You may drink clear liquids until 10:30 the morning of your surgery.   Clear liquids allowed are: Water, Non-Citrus Juices (without pulp), Carbonated Beverages, Clear Tea, Black Coffee ONLY (NO MILK, CREAM OR POWDERED CREAMER of any kind), and Gatorade    Take these medicines the morning of surgery with A SIP OF WATER cabergoline (DOSTINEX) 0.5 MG tablet MYFEMBREE 40-1-0.5 MG TABS  IF NEEDED  acetaminophen (TYLENOL) 500 MG tablet fluticasone (FLONASE) 50 MCG/ACT nasal spray     As of today, STOP taking any Aspirin (unless otherwise instructed by your surgeon) Aleve, Naproxen, Ibuprofen, Motrin, Advil, Goody's, BC's, all herbal medications, fish oil, and all vitamins.  After your COVID test   You are not required to quarantine however you are required to wear a well-fitting mask when you are out and around people not in your household.  If your mask becomes wet or soiled, replace with a new one.  Wash your hands often with soap and water for 20 seconds or clean your hands with an alcohol-based hand sanitizer that contains at least 60% alcohol.  Do not share personal items.  Notify your provider: if you are in close contact with someone who has COVID  or if you develop a fever of 100.4 or greater, sneezing, cough, sore throat, shortness of breath or body aches.           Do not wear jewelry or makeup Do not wear lotions, powders, perfumes, or deodorant. Do not shave 48 hours prior to surgery.   Do not bring valuables to the hospital. DO Not wear nail polish,  gel polish, artificial nails, or any other type of covering on natural nails (fingers and toes) If you have artificial nails or gel coating that need to be removed by a nail salon, please have this removed prior to surgery. Artificial nails or gel coating may interfere with anesthesia's ability to adequately monitor your vital signs.             [facility] is not responsible for any belongings or valuables.  Do NOT Smoke (Tobacco/Vaping)  24 hours prior to your procedure  If you use a CPAP at night, you may bring your mask for your overnight stay.   Contacts, glasses, hearing aids, dentures or partials may not be worn into surgery, please bring cases for these belongings   For patients admitted to the hospital, discharge time will be determined by your treatment team.   Patients discharged the day of surgery will not be allowed to drive home, and someone needs to stay with them for 24 hours.  NO VISITORS WILL BE ALLOWED IN PRE-OP WHERE PATIENTS ARE PREPPED FOR SURGERY.  ONLY 1 SUPPORT PERSON MAY BE PRESENT IN THE WAITING ROOM WHILE YOU ARE IN SURGERY.  IF YOU ARE TO BE ADMITTED, ONCE YOU ARE IN YOUR ROOM YOU WILL BE ALLOWED TWO (2) VISITORS. 1 (ONE) VISITOR MAY STAY OVERNIGHT BUT MUST ARRIVE TO THE ROOM BY 8pm.  Minor children may have two parents present. Special consideration for safety and  communication needs will be reviewed on a case by case basis.  Special instructions:    Oral Hygiene is also important to reduce your risk of infection.  Remember - BRUSH YOUR TEETH THE MORNING OF SURGERY WITH YOUR REGULAR TOOTHPASTE   [facility]- Preparing For Surgery  Before surgery, you can play an important role. Because skin is not sterile, your skin needs to be as free of germs as possible. You can reduce the number of germs on your skin by washing with CHG (chlorahexidine gluconate) Soap before surgery.  CHG is an antiseptic cleaner which kills germs and bonds with the skin to continue killing  germs even after washing.     Please do not use if you have an allergy to CHG or antibacterial soaps. If your skin becomes reddened/irritated stop using the CHG.  Do not shave (including legs and underarms) for at least 48 hours prior to first CHG shower. It is OK to shave your face.  Please follow these instructions carefully.     Shower the NIGHT BEFORE SURGERY and the MORNING OF SURGERY with CHG Soap.   If you chose to wash your hair, wash your hair first as usual with your normal shampoo. After you shampoo, rinse your hair and body thoroughly to remove the shampoo.  Then [employer] and genitals (private parts) with your normal soap and rinse thoroughly to remove soap.  After that Use CHG Soap as you would any other liquid soap. You can apply CHG directly to the skin and wash gently with a scrungie or a clean washcloth.   Apply the CHG Soap to your body ONLY FROM THE NECK DOWN.  Do not use on open wounds or open sores. Avoid contact with your eyes, ears, mouth and genitals (private parts). Wash Face and genitals (private parts)  with your normal soap.   Wash thoroughly, paying special attention to the area where your surgery will be performed.  Thoroughly rinse your body with warm water from the neck down.  DO NOT shower/wash with your normal soap after using and rinsing off the CHG Soap.  Pat yourself dry with a CLEAN TOWEL.  Wear CLEAN PAJAMAS to bed the night before surgery  Place CLEAN SHEETS on your bed the night before your surgery  DO NOT SLEEP WITH PETS.   Day of Surgery:  Take a shower with CHG soap. Wear Clean/Comfortable clothing the morning of surgery Do not apply any deodorants/lotions.   Remember to brush your teeth WITH YOUR REGULAR TOOTHPASTE.   Please read over the following fact sheets that you were given.

## 2021-08-22 ENCOUNTER — Encounter (HOSPITAL_COMMUNITY): Payer: Self-pay

## 2021-08-22 ENCOUNTER — Encounter (HOSPITAL_COMMUNITY)
Admission: RE | Admit: 2021-08-22 | Discharge: 2021-08-22 | Disposition: A | Discharge status: Home / Self Care | Payer: BC Managed Care – PPO | Source: Ambulatory Visit | Attending: Obstetrics and Gynecology | Admitting: Obstetrics and Gynecology

## 2021-08-22 ENCOUNTER — Other Ambulatory Visit: Payer: Self-pay

## 2021-08-22 VITALS — BP 106/79 | HR 95 | Temp 98.0°F | Resp 17 | Ht 65.0 in | Wt 131.2 lb

## 2021-08-22 DIAGNOSIS — D352 Benign neoplasm of pituitary gland: Secondary | ICD-10-CM | POA: Diagnosis not present

## 2021-08-22 DIAGNOSIS — Z01812 Encounter for preprocedural laboratory examination: Secondary | ICD-10-CM | POA: Insufficient documentation

## 2021-08-22 LAB — CBC
HCT: 36.9 % (ref 36.0–46.0)
Hemoglobin: 12.2 g/dL (ref 12.0–15.0)
MCH: 28 pg (ref 26.0–34.0)
MCHC: 33.1 g/dL (ref 30.0–36.0)
MCV: 84.8 fL (ref 80.0–100.0)
Platelets: 397 10*3/uL (ref 150–400)
RBC: 4.35 MIL/uL (ref 3.87–5.11)
RDW: 13.2 % (ref 11.5–15.5)
WBC: 6.8 10*3/uL (ref 4.0–10.5)
nRBC: 0 % (ref 0.0–0.2)

## 2021-08-22 NOTE — Progress Notes (Signed)
PCP - Dr. Glendale Chard Cardiologist - None  Chest x-ray - Not indicated EKG - Not indicated Stress Test - Denies ECHO - Denies Cardiac Cath - Denies  Sleep Study - Denies  DM - Denies  ERAS Protcol - Yes instructions given  Anesthesia review: No  Patient denies shortness of breath, fever, cough and chest pain at PAT appointment   All instructions explained to the patient, with a verbal understanding of the material. Patient agrees to go over the instructions while at home for a better understanding.  The opportunity to ask questions was provided.

## 2021-08-25 NOTE — Progress Notes (Signed)
left message with new arrival time of 0920

## 2021-08-28 ENCOUNTER — Other Ambulatory Visit: Payer: Self-pay

## 2021-08-28 ENCOUNTER — Ambulatory Visit (HOSPITAL_COMMUNITY)
Admission: RE | Admit: 2021-08-28 | Discharge: 2021-08-28 | Disposition: A | Discharge status: Home / Self Care | Payer: BC Managed Care – PPO | Attending: Obstetrics and Gynecology | Admitting: Obstetrics and Gynecology

## 2021-08-28 ENCOUNTER — Encounter (HOSPITAL_COMMUNITY): Payer: Self-pay | Admitting: Obstetrics and Gynecology

## 2021-08-28 ENCOUNTER — Encounter (HOSPITAL_COMMUNITY)
Admission: RE | Disposition: A | Discharge status: Home / Self Care | Payer: Self-pay | Source: Home / Self Care | Attending: Obstetrics and Gynecology

## 2021-08-28 ENCOUNTER — Ambulatory Visit (HOSPITAL_COMMUNITY): Payer: BC Managed Care – PPO | Admitting: Anesthesiology

## 2021-08-28 DIAGNOSIS — D259 Leiomyoma of uterus, unspecified: Secondary | ICD-10-CM | POA: Diagnosis present

## 2021-08-28 HISTORY — PX: DILATATION & CURETTAGE/HYSTEROSCOPY WITH MYOSURE: SHX6511

## 2021-08-28 HISTORY — PX: HYSTEROSCOPY WITH NOVASURE: SHX5574

## 2021-08-28 LAB — POCT PREGNANCY, URINE: Preg Test, Ur: NEGATIVE

## 2021-08-28 LAB — BASIC METABOLIC PANEL
Anion gap: 8 (ref 5–15)
BUN: 10 mg/dL (ref 6–20)
CO2: 24 mmol/L (ref 22–32)
Calcium: 9.1 mg/dL (ref 8.9–10.3)
Chloride: 108 mmol/L (ref 98–111)
Creatinine, Ser: 0.83 mg/dL (ref 0.44–1.00)
GFR, Estimated: >60 mL/min (ref >60)
Glucose, Bld: 90 mg/dL (ref 70–99)
Potassium: 3.7 mmol/L (ref 3.5–5.1)
Sodium: 140 mmol/L (ref 135–145)

## 2021-08-28 LAB — TYPE AND SCREEN
ABO/RH(D): B POS
Antibody Screen: NEGATIVE

## 2021-08-28 LAB — ABO/RH: ABO/RH(D): B POS

## 2021-08-28 SURGERY — DILATATION & CURETTAGE/HYSTEROSCOPY WITH MYOSURE
Anesthesia: General | Site: Uterus

## 2021-08-28 MED ORDER — DEXAMETHASONE SODIUM PHOSPHATE 10 MG/ML IJ SOLN
INTRAMUSCULAR | Status: AC
  Filled 2021-08-28: qty 1

## 2021-08-28 MED ORDER — OXYCODONE HCL 5 MG PO TABS: 5.0000 mg | ORAL_TABLET | Freq: Once | ORAL | Status: DC | PRN

## 2021-08-28 MED ORDER — ONDANSETRON HCL 4 MG/2ML IJ SOLN: 4.0000 mg | Freq: Once | INTRAMUSCULAR | Status: DC | PRN

## 2021-08-28 MED ORDER — PROPOFOL 10 MG/ML IV BOLUS
INTRAVENOUS | Status: DC | PRN
  Administered 2021-08-28: 140 mg via INTRAVENOUS

## 2021-08-28 MED ORDER — CHLORHEXIDINE GLUCONATE 0.12 % MT SOLN: 15.0000 mL | Freq: Once | OROMUCOSAL | Status: AC

## 2021-08-28 MED ORDER — CEFAZOLIN SODIUM-DEXTROSE 2-4 GM/100ML-% IV SOLN
2.0000 g | INTRAVENOUS | Status: AC
  Administered 2021-08-28: 2 g via INTRAVENOUS

## 2021-08-28 MED ORDER — FENTANYL CITRATE (PF) 100 MCG/2ML IJ SOLN: 25.0000 ug | INTRAMUSCULAR | Status: DC | PRN

## 2021-08-28 MED ORDER — CEFAZOLIN SODIUM-DEXTROSE 2-4 GM/100ML-% IV SOLN
INTRAVENOUS | Status: AC
  Filled 2021-08-28: qty 100

## 2021-08-28 MED ORDER — DEXAMETHASONE SODIUM PHOSPHATE 10 MG/ML IJ SOLN
INTRAMUSCULAR | Status: DC | PRN
  Administered 2021-08-28: 10 mg via INTRAVENOUS

## 2021-08-28 MED ORDER — STERILE WATER FOR IRRIGATION IR SOLN
Status: DC | PRN
  Administered 2021-08-28: 1000 mL

## 2021-08-28 MED ORDER — POVIDONE-IODINE 10 % EX SWAB
2.0000 "application " | Freq: Once | CUTANEOUS | Status: AC
  Administered 2021-08-28: 2 via TOPICAL

## 2021-08-28 MED ORDER — LIDOCAINE 2% (20 MG/ML) 5 ML SYRINGE
INTRAMUSCULAR | Status: DC | PRN
  Administered 2021-08-28: 60 mg via INTRAVENOUS

## 2021-08-28 MED ORDER — MEPERIDINE HCL 25 MG/ML IJ SOLN: 6.2500 mg | INTRAMUSCULAR | Status: DC | PRN

## 2021-08-28 MED ORDER — ONDANSETRON HCL 4 MG/2ML IJ SOLN
INTRAMUSCULAR | Status: AC
  Filled 2021-08-28: qty 2

## 2021-08-28 MED ORDER — ONDANSETRON HCL 4 MG/2ML IJ SOLN
INTRAMUSCULAR | Status: DC | PRN
  Administered 2021-08-28: 4 mg via INTRAVENOUS

## 2021-08-28 MED ORDER — LIDOCAINE HCL 1 % IJ SOLN
INTRAMUSCULAR | Status: DC | PRN
  Administered 2021-08-28: 10 mL

## 2021-08-28 MED ORDER — CHLORHEXIDINE GLUCONATE 0.12 % MT SOLN
OROMUCOSAL | Status: AC
  Administered 2021-08-28: 15 mL via OROMUCOSAL
  Filled 2021-08-28: qty 15

## 2021-08-28 MED ORDER — IBUPROFEN 600 MG PO TABS: 600.0000 mg | ORAL_TABLET | Freq: Four times a day (QID) | ORAL | 1 refills | Status: DC | PRN

## 2021-08-28 MED ORDER — LACTATED RINGERS IV SOLN: INTRAVENOUS | Status: DC

## 2021-08-28 MED ORDER — OXYCODONE-ACETAMINOPHEN 5-325 MG PO TABS: 1.0000 | ORAL_TABLET | Freq: Four times a day (QID) | ORAL | 0 refills | Status: DC | PRN

## 2021-08-28 MED ORDER — ACETAMINOPHEN 160 MG/5ML PO SOLN: 325.0000 mg | ORAL | Status: DC | PRN

## 2021-08-28 MED ORDER — DEXMEDETOMIDINE (PRECEDEX) IN NS 20 MCG/5ML (4 MCG/ML) IV SYRINGE
PREFILLED_SYRINGE | INTRAVENOUS | Status: DC | PRN
  Administered 2021-08-28: 4 ug via INTRAVENOUS

## 2021-08-28 MED ORDER — PHENYLEPHRINE 40 MCG/ML (10ML) SYRINGE FOR IV PUSH (FOR BLOOD PRESSURE SUPPORT)
PREFILLED_SYRINGE | INTRAVENOUS | Status: DC | PRN
  Administered 2021-08-28: 80 ug via INTRAVENOUS
  Administered 2021-08-28 (×2): 40 ug via INTRAVENOUS
  Administered 2021-08-28: 80 ug via INTRAVENOUS
  Administered 2021-08-28: 40 ug via INTRAVENOUS

## 2021-08-28 MED ORDER — FENTANYL CITRATE (PF) 250 MCG/5ML IJ SOLN
INTRAMUSCULAR | Status: AC
  Filled 2021-08-28: qty 5

## 2021-08-28 MED ORDER — MIDAZOLAM HCL 2 MG/2ML IJ SOLN
INTRAMUSCULAR | Status: AC
  Filled 2021-08-28: qty 2

## 2021-08-28 MED ORDER — CHLOROPROCAINE HCL 1 % IJ SOLN
INTRAMUSCULAR | Status: AC
  Filled 2021-08-28: qty 30

## 2021-08-28 MED ORDER — MIDAZOLAM HCL 2 MG/2ML IJ SOLN
INTRAMUSCULAR | Status: DC | PRN
Start: 2021-08-28 — End: 2021-08-28
  Administered 2021-08-28: 2 mg via INTRAVENOUS

## 2021-08-28 MED ORDER — ACETAMINOPHEN 10 MG/ML IV SOLN
INTRAVENOUS | Status: DC | PRN
  Administered 2021-08-28: 1000 mg via INTRAVENOUS

## 2021-08-28 MED ORDER — ORAL CARE MOUTH RINSE: 15.0000 mL | Freq: Once | OROMUCOSAL | Status: AC

## 2021-08-28 MED ORDER — PHENYLEPHRINE HCL-NACL 20-0.9 MG/250ML-% IV SOLN
INTRAVENOUS | Status: DC | PRN
Start: 2021-08-28 — End: 2021-08-28
  Administered 2021-08-28: 10 ug/min via INTRAVENOUS

## 2021-08-28 MED ORDER — OXYCODONE HCL 5 MG/5ML PO SOLN: 5.0000 mg | Freq: Once | ORAL | Status: DC | PRN

## 2021-08-28 MED ORDER — LIDOCAINE HCL (PF) 1 % IJ SOLN
INTRAMUSCULAR | Status: AC
  Filled 2021-08-28: qty 30

## 2021-08-28 MED ORDER — SODIUM CHLORIDE 0.9 % IR SOLN
Status: DC | PRN
  Administered 2021-08-28 (×3): 3000 mL

## 2021-08-28 MED ORDER — FENTANYL CITRATE (PF) 250 MCG/5ML IJ SOLN
INTRAMUSCULAR | Status: DC | PRN
Start: 2021-08-28 — End: 2021-08-28
  Administered 2021-08-28: 25 ug via INTRAVENOUS
  Administered 2021-08-28: 50 ug via INTRAVENOUS
  Administered 2021-08-28: 25 ug via INTRAVENOUS

## 2021-08-28 MED ORDER — ACETAMINOPHEN 325 MG PO TABS: 325.0000 mg | ORAL_TABLET | ORAL | Status: DC | PRN

## 2021-08-28 MED ORDER — ACETAMINOPHEN 10 MG/ML IV SOLN
INTRAVENOUS | Status: AC
  Filled 2021-08-28: qty 100

## 2021-08-28 MED ORDER — LIDOCAINE 2% (20 MG/ML) 5 ML SYRINGE
INTRAMUSCULAR | Status: AC
  Filled 2021-08-28: qty 5

## 2021-08-28 SURGICAL SUPPLY — 30 items
ABLATOR SURESOUND NOVASURE (ABLATOR) ×1 IMPLANT
CANISTER SUCT 3000ML PPV (MISCELLANEOUS) ×1 IMPLANT
CATH ROBINSON RED A/P 14FR (CATHETERS) ×1 IMPLANT
CATH ROBINSON RED A/P 16FR (CATHETERS) ×2 IMPLANT
DEVICE MYOSURE LITE (MISCELLANEOUS) IMPLANT
DEVICE MYOSURE REACH (MISCELLANEOUS) IMPLANT
DRSG TELFA 3X8 NADH (GAUZE/BANDAGES/DRESSINGS) ×2 IMPLANT
ELECT DISPERSIVE SONATA (ELECTRODE) ×4 IMPLANT
ELECT REM PT RETURN 9FT ADLT (ELECTROSURGICAL)
ELECTRODE REM PT RTRN 9FT ADLT (ELECTROSURGICAL) ×1 IMPLANT
GAUZE 4X4 16PLY ~~LOC~~+RFID DBL (SPONGE) ×3 IMPLANT
GLOVE SURG ENC MOIS LTX SZ7.5 (GLOVE) ×2 IMPLANT
GLOVE SURG UNDER POLY LF SZ7 (GLOVE) ×2 IMPLANT
GLOVE SURG UNDER POLY LF SZ7.5 (GLOVE) ×2 IMPLANT
GOWN STRL REUS W/ TWL LRG LVL3 (GOWN DISPOSABLE) ×2 IMPLANT
GOWN STRL REUS W/TWL LRG LVL3 (GOWN DISPOSABLE) ×4
HANDPIECE RFA SONATA (MISCELLANEOUS) ×2 IMPLANT
KIT PROCEDURE FLUENT (KITS) ×2 IMPLANT
KIT TURNOVER KIT B (KITS) ×2 IMPLANT
MYOSURE XL FIBROID (MISCELLANEOUS) ×2
PACK VAGINAL MINOR WOMEN LF (CUSTOM PROCEDURE TRAY) ×2 IMPLANT
PAD DRESSING TELFA 3X8 NADH (GAUZE/BANDAGES/DRESSINGS) IMPLANT
PAD OB MATERNITY 4.3X12.25 (PERSONAL CARE ITEMS) ×2 IMPLANT
SEAL ROD LENS SCOPE MYOSURE (ABLATOR) ×2 IMPLANT
SYR 50ML LL SCALE MARK (SYRINGE) ×2 IMPLANT
SYSTEM TISS REMOVAL MYOSURE XL (MISCELLANEOUS) IMPLANT
TOWEL GREEN STERILE FF (TOWEL DISPOSABLE) ×4 IMPLANT
TRAY FOLEY W/BAG SLVR 14FR (SET/KITS/TRAYS/PACK) ×1 IMPLANT
UNDERPAD 30X36 HEAVY ABSORB (UNDERPADS AND DIAPERS) ×2 IMPLANT
WATER STERILE IRR 1000ML POUR (IV SOLUTION) ×2 IMPLANT

## 2021-08-28 NOTE — Anesthesia Preprocedure Evaluation (Addendum)
Anesthesia Evaluation  Patient identified by MRN, date of birth, ID band Patient awake    Reviewed: Allergy & Precautions, H&P , NPO status , Patient's Chart, lab work & pertinent test results, reviewed documented beta blocker date and time   Airway Mallampati: I  TM Distance: >3 FB Neck ROM: full    Dental no notable dental hx. (+) Teeth Intact, Dental Advisory Given   Pulmonary neg pulmonary ROS,    Pulmonary exam normal breath sounds clear to auscultation       Cardiovascular Exercise Tolerance: Good negative cardio ROS   Rhythm:regular Rate:Normal     Neuro/Psych negative neurological ROS  negative psych ROS   GI/Hepatic negative GI ROS, Neg liver ROS,   Endo/Other  negative endocrine ROS  Renal/GU negative Renal ROS  negative genitourinary   Musculoskeletal   Abdominal   Peds  Hematology  (+) Blood dyscrasia, anemia ,   Anesthesia Other Findings   Reproductive/Obstetrics negative OB ROS                            Anesthesia Physical Anesthesia Plan  ASA: 1 and emergent  Anesthesia Plan: General   Post-op Pain Management: Ofirmev IV (intra-op) and Toradol IV (intra-op)   Induction: Intravenous  PONV Risk Score and Plan: 3 and Ondansetron, Dexamethasone and Treatment may vary due to age or medical condition  Airway Management Planned: Oral ETT and LMA  Additional Equipment:   Intra-op Plan:   Post-operative Plan: Extubation in OR  Informed Consent: I have reviewed the patients History and Physical, chart, labs and discussed the procedure including the risks, benefits and alternatives for the proposed anesthesia with the patient or authorized representative who has indicated his/her understanding and acceptance.     Dental Advisory Given  Plan Discussed with: CRNA and Anesthesiologist  Anesthesia Plan Comments: (  )       Anesthesia Quick Evaluation

## 2021-08-28 NOTE — Transfer of Care (Signed)
Immediate Anesthesia Transfer of Care Note  Patient: SAMANVI CUCCIA  Procedure(s) Performed: DILATATION & CURETTAGE/HYSTEROSCOPY WITH MYOSURE (Uterus) RADIO FREQUENCY ABLATION WITH SONATA (Uterus) ENDOMETRIAL ABLATION WITH NOVASURE (Uterus)  Patient Location: PACU  Anesthesia Type:General  Level of Consciousness: drowsy  Airway & Oxygen Therapy: Patient Spontanous Breathing and Patient connected to nasal cannula oxygen  Post-op Assessment: Report given to RN and Post -op Vital signs reviewed and stable  Post vital signs: Reviewed and stable  Last Vitals:  Vitals Value Taken Time  BP 114/73 08/28/21 1513  Temp    Pulse 80 08/28/21 1515  Resp 19 08/28/21 1515  SpO2 100 % 08/28/21 1515  Vitals shown include unvalidated device data.  Last Pain:  Vitals:   08/28/21 0943  TempSrc:   PainSc: 0-No pain         Complications: No notable events documented.

## 2021-08-28 NOTE — Op Note (Signed)
Preop Diagnosis: SYMPTOMATIC LEIOMYOMA OF UTERUS   Postop Diagnosis: SYMPTOMATIC LEIOMYOMA OF UTERUS   Procedure: 1.HYSTEROSCOPY 2.DILATATION & CURETTAGE 3.SONATA TRANSCERVICAL RADIOFREQUENCY ABLATION OF FIBROIDS 4.[place] ENDOMETRIAL ABLATION  Anesthesia: General   Anesthesiologist: Janeece Riggers, MD   Attending: Everett Graff, MD   Assistant: N/a  Findings: Multiple fibroids noted including a large submucosal fibroid at the fundus.   3 ablated as follows: 1.12 oclock Fundal 2.9 x 2.2cm 2 min 54 secs, 2.4cm x 1.7cm 2 min 2.12 o'clock Mid 2.4 x 1.7cm 2 min 3.6 o'clock Fundal 3.5 x 2.6cm, 3.3 x 2.4cm 3 min 36 secs Sounding length 11 cm Cervical length 4.5 cm Cavity length 6.5 cm] Cavity width 4.6cm Power 164 watts Time 1 min 18 secs   Pathology: 1.Resected portions of fibroid 2.Endometrial Curettings  Fluids: 1000 cc  UOP: 200 cc  EBL: 15 cc  Complications: None  Procedure:  The patient was taken to the operating room after the risks, benefits and alternatives discussed with the patient. The patient verbalized understanding and consent signed and witnessed. The patient was placed under anesthesia and prepped and draped in the normal sterile fashion and Time Out performed per protocol. A bivalve speculum was placed in the patient's vagina and the anterior lip of the cervix was grasped with a single tooth tenaculum. A paracervical block was administered using a total of 10 cc of 1% lidocaine. The uterus sounded to 11 cm. The cervix was dilated for passage of the hysteroscope. The hysteroscope was introduced into the uterine cavity and findings as noted above. Sonata was performed without difficulty as above.  Myosure performed until submucosal fibroid flush with the anterior wall.  Sharp curettage was performed until a gritty texture was noted and currettings sent to pathology. T he Novasure instrument was introduced and ablation performed without  difficulty. Hysteroscope reintroduced and good ablation results were noted. All instruments were removed. Sponge lap and needle count was correct. The patient tolerated the procedure well and was returned to the recovery room in good condition.

## 2021-08-28 NOTE — H&P (Signed)
Rebecca [name] is an 51 y.o. female. G0 with h/o menorrhagia d/t symptomatic fibroids.  Pt scheduled for Hysteroscopy, transcervical radiofrequency ablation of fibroids and endometrial ablation.  Pt does not desire future fertility. Embx - benign. H/o thyroid nodules with plan for biopsies to be scheduled after this surgery. Ultrasound revealed - 3 IM fibroids 3-5cm  Pertinent Gynecological History: H/o regular cycles Menstrual History: LMP 08/06/21 twice a month cycles lasting 5-7days with discomfort not pain worsening over the last 1-1 1/52yrs Patient's last menstrual period was 08/06/2021 (approximate).    Past Medical History:  Diagnosis Date   Iron deficiency anemia    Leiomyoma   Pituitary adenoma Vitamin D Deficiency  Past Surgical History:  Procedure Laterality Date   MYOMECTOMY  2002    Family History  Problem Relation Age of Onset   Lymphoma Mother    Prostate cancer Father    Breast cancer Neg Hx     Social History:  reports that she has never smoked. She has never used smokeless tobacco. She reports that she does not drink alcohol and does not use drugs.  Allergies: No Known Allergies  Medications Prior to Admission  Medication Sig Dispense Refill Last Dose   acetaminophen (TYLENOL) 500 MG tablet Take 1,000 mg by mouth every 6 (six) hours as needed for moderate pain.   Past Month   Ca Carbonate-Mag Hydroxide (ROLAIDS PO) Take 1 tablet by mouth daily as needed (acid reflux).   Past Month   cabergoline (DOSTINEX) 0.5 MG tablet Take 0.5 mg by mouth 2 (two) times a week.   08/26/2021   cholecalciferol (VITAMIN D3) 25 MCG (1000 UNIT) tablet Take 1,000 Units by mouth daily.   Past Week   Iron-FA-B Cmp-C-Biot-Probiotic (FUSION PLUS) CAPS TAKE 1 CAPSULE BY MOUTH EVERY DAY 30 capsule 1 08/27/2021   tranexamic acid (LYSTEDA) 650 MG TABS tablet Take 1,300 mg by mouth every 8 (eight) hours as needed.   Past Month   fluticasone (FLONASE) 50 MCG/ACT nasal spray PLACE 1 SPRAY INTO  BOTH NOSTRILS EVERY DAY AS NEEDED. 16 mL 1 More than a month   MYFEMBREE 40-1-0.5 MG TABS Take 1 tablet by mouth daily.       Review of Systems No constitutional sxs.  Denies fever, chills, n/v/d. Blood pressure 115/67, pulse 83, temperature 98 F (36.7 C), temperature source Oral, resp. rate 17, height 5' 5.5" (1.664 m), weight 59.4 kg, last menstrual period 08/06/2021, SpO2 100 %. Physical Exam Lungs CTA CV RRR Abd soft non-tender Ext no calf tenderness  Results for orders placed or performed during the hospital encounter of 08/28/21 (from the past 24 hour(s))  Pregnancy, urine POC     Status: None   Collection Time: 08/28/21  9:54 AM  Result Value Ref Range   Preg Test, Ur NEGATIVE NEGATIVE  Type and screen     Status: None   Collection Time: 08/28/21  9:55 AM  Result Value Ref Range   ABO/RH(D) B POS    Antibody Screen NEG    Sample Expiration      08/31/2021,2359 Performed at [facility] [address] [place], [place] [address]   ABO/Rh     Status: None   Collection Time: 08/28/21 10:00 AM  Result Value Ref Range   ABO/RH(D)      B POS Performed at [facility] [address], [place], [place] [address]   Basic metabolic panel     Status: None   Collection Time: 08/28/21  10:21 AM  Result Value Ref Range   Sodium 140 135 - 145 mmol/L   Potassium 3.7 3.5 - 5.1 mmol/L   Chloride 108 98 - 111 mmol/L   CO2 24 22 - 32 mmol/L   Glucose, Bld 90 70 - 99 mg/dL   BUN 10 6 - 20 mg/dL   Creatinine, Ser 0.83 0.44 - 1.00 mg/dL   Calcium 9.1 8.9 - 10.3 mg/dL   GFR, Estimated >60 >60 mL/min   Anion gap 8 5 - 15    No results found.  Assessment/Plan: G0 with symptomatic fibroids presenting for hysteroscopy D&C Sonata and Ablation.  Risks benefits alternatives reviewed with the patient including but not limited to bleeding infection and injury.  Consent signed and witnessed.  Delice Lesch 08/28/2021, 12:31 PM

## 2021-08-28 NOTE — Anesthesia Postprocedure Evaluation (Signed)
Anesthesia Post Note  Patient: Rebecca [name]  Procedure(s) Performed: DILATATION & CURETTAGE/HYSTEROSCOPY WITH MYOSURE (Uterus) RADIO FREQUENCY ABLATION WITH SONATA (Uterus) ENDOMETRIAL ABLATION WITH NOVASURE (Uterus)     Patient location during evaluation: PACU Anesthesia Type: General Level of consciousness: awake and alert Pain management: pain level controlled Vital Signs Assessment: post-procedure vital signs reviewed and stable Respiratory status: spontaneous breathing, nonlabored ventilation, respiratory function stable and patient connected to nasal cannula oxygen Cardiovascular status: blood pressure returned to baseline and stable Postop Assessment: no apparent nausea or vomiting Anesthetic complications: no   No notable events documented.  Last Vitals:  Vitals:   08/28/21 1515 08/28/21 1530  BP: 114/73 113/63  Pulse: 80 67  Resp: (!) 26 19  Temp: 36.5 C   SpO2: 100% 99%    Last Pain:  Vitals:   08/28/21 1515  TempSrc:   PainSc: Asleep                 Luverna Degenhart

## 2021-08-28 NOTE — Anesthesia Procedure Notes (Signed)
Procedure Name: LMA Insertion Date/Time: 08/28/2021 1:02 PM Performed by: Lorie Phenix, CRNA Pre-anesthesia Checklist: Patient identified, Emergency Drugs available, Suction available and Patient being monitored Patient Re-evaluated:Patient Re-evaluated prior to induction Oxygen Delivery Method: Circle System Utilized Preoxygenation: Pre-oxygenation with 100% oxygen Induction Type: IV induction Ventilation: Mask ventilation without difficulty LMA: LMA inserted LMA Size: 4.0 Number of attempts: 1 Placement Confirmation: positive ETCO2 Tube secured with: Tape Dental Injury: Teeth and Oropharynx as per pre-operative assessment

## 2021-08-29 ENCOUNTER — Encounter (HOSPITAL_COMMUNITY): Payer: Self-pay | Admitting: Obstetrics and Gynecology

## 2021-08-29 LAB — SURGICAL PATHOLOGY

## 2021-08-30 ENCOUNTER — Other Ambulatory Visit: Payer: Self-pay | Admitting: Internal Medicine

## 2021-09-12 ENCOUNTER — Ambulatory Visit
Admission: RE | Admit: 2021-09-12 | Discharge: 2021-09-12 | Disposition: A | Discharge status: Home / Self Care | Payer: BC Managed Care – PPO | Source: Ambulatory Visit | Attending: Obstetrics and Gynecology | Admitting: Obstetrics and Gynecology

## 2021-09-12 ENCOUNTER — Other Ambulatory Visit (HOSPITAL_COMMUNITY)
Admission: RE | Admit: 2021-09-12 | Discharge: 2021-09-12 | Disposition: A | Discharge status: Home / Self Care | Payer: BC Managed Care – PPO | Source: Ambulatory Visit | Attending: Obstetrics and Gynecology | Admitting: Obstetrics and Gynecology

## 2021-09-12 DIAGNOSIS — E041 Nontoxic single thyroid nodule: Secondary | ICD-10-CM

## 2021-09-14 LAB — CYTOLOGY - NON PAP

## 2021-10-03 ENCOUNTER — Encounter (HOSPITAL_COMMUNITY): Payer: Self-pay

## 2021-12-12 ENCOUNTER — Other Ambulatory Visit: Payer: Self-pay | Admitting: Internal Medicine

## 2022-02-21 ENCOUNTER — Ambulatory Visit: Payer: BC Managed Care – PPO | Admitting: Internal Medicine

## 2022-02-27 ENCOUNTER — Other Ambulatory Visit: Payer: Self-pay | Admitting: Internal Medicine

## 2022-02-27 ENCOUNTER — Ambulatory Visit: Payer: BC Managed Care – PPO | Admitting: Internal Medicine

## 2022-02-27 ENCOUNTER — Encounter: Payer: Self-pay | Admitting: Internal Medicine

## 2022-02-27 VITALS — BP 106/60 | HR 84 | Temp 98.4°F | Ht 65.0 in | Wt 137.6 lb

## 2022-02-27 DIAGNOSIS — E041 Nontoxic single thyroid nodule: Secondary | ICD-10-CM

## 2022-02-27 DIAGNOSIS — K219 Gastro-esophageal reflux disease without esophagitis: Secondary | ICD-10-CM

## 2022-02-27 DIAGNOSIS — Z23 Encounter for immunization: Secondary | ICD-10-CM | POA: Diagnosis not present

## 2022-02-27 DIAGNOSIS — D500 Iron deficiency anemia secondary to blood loss (chronic): Secondary | ICD-10-CM

## 2022-02-27 MED ORDER — FAMOTIDINE 20 MG PO TABS: 20.0000 mg | ORAL_TABLET | Freq: Every day | ORAL | 1 refills | Status: DC

## 2022-02-27 NOTE — Progress Notes (Unsigned)
Barnet Glasgow Martin,acting as a Education administrator for Maximino Greenland, MD.,have documented all relevant documentation on the behalf of Maximino Greenland, MD,as directed by  Maximino Greenland, MD while in the presence of Maximino Greenland, MD.    Subjective:     Patient ID: Rebecca [name] , female    DOB: Oct 02, 1970 , 51 y.o.   MRN: [id number]   Chief Complaint  Patient presents with   Gastroesophageal Reflux    HPI  Patient presents today due to having reflux. Patient states she has a feeling of something sitting in her chest, she also states she feels like she can feel the acid in her throat. Patient states she also has a cough she thinks comes from reflux. These things started occurring 2 weeks ago. BP Readings from Last 3 Encounters: 02/27/22 : 106/60 08/28/21 : 104/61 08/22/21 : 106/79    Gastroesophageal Reflux She complains of coughing. This is a new problem. The current episode started 1 to 4 weeks ago. The problem occurs frequently. The problem has been rapidly worsening.     Past Medical History:  Diagnosis Date   Iron deficiency anemia    Leiomyoma      Family History  Problem Relation Age of Onset   Lymphoma Mother    Prostate cancer Father    Breast cancer Neg Hx      Current Outpatient Medications:    cabergoline (DOSTINEX) 0.5 MG tablet, Take 0.5 mg by mouth 2 (two) times a week., Disp: , Rfl:    cholecalciferol (VITAMIN D3) 25 MCG (1000 UNIT) tablet, Take 1,000 Units by mouth daily., Disp: , Rfl:    fluticasone (FLONASE) 50 MCG/ACT nasal spray, PLACE 1 SPRAY INTO BOTH NOSTRILS EVERY DAY AS NEEDED., Disp: 16 mL, Rfl: 1   Iron-FA-B Cmp-C-Biot-Probiotic (FUSION PLUS) CAPS, TAKE 1 CAPSULE BY MOUTH EVERY DAY, Disp: 30 capsule, Rfl: 1   Ca Carbonate-Mag Hydroxide (ROLAIDS PO), Take 1 tablet by mouth daily as needed (acid reflux). (Patient not taking: Reported on 02/27/2022), Disp: , Rfl:    No Known Allergies   Review of Systems  Constitutional: Negative.   HENT: Negative.     Eyes: Negative.   Respiratory:  Positive for cough.   Cardiovascular: Negative.   Gastrointestinal: Negative.      Today's Vitals   02/27/22 1553  BP: 106/60  Pulse: 84  Temp: 98.4 F (36.9 C)  TempSrc: Oral  Weight: 137 lb 9.6 oz (62.4 kg)  Height: '5\' 5"'$  (1.651 m)  PainSc: 0-No pain   Body mass index is 22.9 kg/m.  Wt Readings from Last 3 Encounters:  02/27/22 137 lb 9.6 oz (62.4 kg)  08/28/21 131 lb (59.4 kg)  08/22/21 131 lb 3.2 oz (59.5 kg)     Objective:  Physical Exam      Assessment And Plan:     1. Need for vaccination     Patient was given opportunity to ask questions. Patient verbalized understanding of the plan and was able to repeat key elements of the plan. All questions were answered to their satisfaction.  Rebecca [name], [place], Rebecca [name], CMA, have reviewed all documentation for this visit. The documentation on 02/27/22 for the exam, diagnosis, procedures, and orders are all accurate and complete.   IF YOU HAVE BEEN REFERRED TO A SPECIALIST, IT MAY TAKE 1-2 WEEKS TO SCHEDULE/PROCESS THE REFERRAL. IF YOU HAVE NOT HEARD FROM US/SPECIALIST IN TWO WEEKS, PLEASE GIVE [place] A CALL AT [phone] X  [phone].   THE PATIENT IS ENCOURAGED TO PRACTICE SOCIAL DISTANCING DUE TO THE COVID-19 PANDEMIC.

## 2022-02-27 NOTE — Patient Instructions (Signed)

## 2022-02-28 LAB — CBC
Hematocrit: 39.3 % (ref 34.0–46.6)
Hemoglobin: 13.2 g/dL (ref 11.1–15.9)
MCH: 28.4 pg (ref 26.6–33.0)
MCHC: 33.6 g/dL (ref 31.5–35.7)
MCV: 85 fL (ref 79–97)
Platelets: 353 10*3/uL (ref 150–450)
RBC: 4.65 x10E6/uL (ref 3.77–5.28)
RDW: 12.6 % (ref 11.7–15.4)
WBC: 9.2 10*3/uL (ref 3.4–10.8)

## 2022-02-28 LAB — IRON,TIBC AND FERRITIN PANEL
Ferritin: 51 ng/mL (ref 15–150)
Iron Saturation: 21 % (ref 15–55)
Iron: 74 ug/dL (ref 27–159)
Total Iron Binding Capacity: 357 ug/dL (ref 250–450)
UIBC: 283 ug/dL (ref 131–425)

## 2022-02-28 LAB — TSH+FREE T4
Free T4: 1.12 ng/dL (ref 0.82–1.77)
TSH: 2.12 u[IU]/mL (ref 0.450–4.500)

## 2022-03-01 ENCOUNTER — Other Ambulatory Visit: Payer: Self-pay | Admitting: Internal Medicine

## 2022-03-01 DIAGNOSIS — Z1231 Encounter for screening mammogram for malignant neoplasm of breast: Secondary | ICD-10-CM

## 2022-03-05 ENCOUNTER — Ambulatory Visit
Admission: RE | Admit: 2022-03-05 | Discharge: 2022-03-05 | Disposition: A | Discharge status: Home / Self Care | Payer: BC Managed Care – PPO | Source: Ambulatory Visit | Attending: Internal Medicine | Admitting: Internal Medicine

## 2022-03-05 DIAGNOSIS — Z1231 Encounter for screening mammogram for malignant neoplasm of breast: Secondary | ICD-10-CM

## 2022-03-08 ENCOUNTER — Ambulatory Visit: Payer: BC Managed Care – PPO

## 2022-03-22 ENCOUNTER — Other Ambulatory Visit: Payer: Self-pay | Admitting: Internal Medicine

## 2022-03-29 ENCOUNTER — Ambulatory Visit: Payer: Self-pay | Admitting: Surgery

## 2022-03-29 NOTE — H&P (Signed)
REFERRING PHYSICIAN:  Leilani [name]*   PROVIDER:  Kelsen Celona Charlotta Newton, MD   MRN: [id number] DOB: 10/19/1970 DATE OF ENCOUNTER: 03/29/2022   Subjective    Chief Complaint: New Consultation (Right thyroid nodule)       History of Present Illness:   Patient is referred referred by Dr. 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  Patient had undergone an ultrasound examination ordered by her gynecologist, Dr. Everett [name].  This demonstrated a normal-sized thyroid gland with a solitary 2.8 cm nodule in the right thyroid lobe.  Ultrasound-guided fine-needle aspiration biopsy was performed on September 12, 2021.  This demonstrated atypia of undetermined significance, Bethesda category III.  Specimen was subsequently submitted for molecular genetic testing, AFIRMA, and returned with a result of suspicious.  DNA testing revealed a mutation increasing the risk of malignancy to approximately 75%.  Patient is therefore referred to surgery for resection for definitive diagnosis and management.  Patient has no prior history of thyroid disease.  She has never been on thyroid medication.  Recent TSH level is normal at 2.12.  There is no family history of thyroid disease, and specifically there is no family history of thyroid cancer.  Has had no prior head or neck surgery.  Patient has been evaluated by her primary care physician, her gynecologist, and her endocrinologist, Dr. Dagmar [name].  Patient presents today to discuss thyroid surgery.     Review of Systems: A complete review of systems was obtained from the patient.  I have reviewed this information and discussed as appropriate with the patient.  See HPI as well for other ROS.   Review of Systems  Constitutional: Negative.   HENT: Negative.    Eyes: Negative.   Respiratory: Negative.    Cardiovascular: Negative.    Gastrointestinal: Negative.   Genitourinary: Negative.   Musculoskeletal: Negative.   Skin: Negative.   Neurological: Negative.   Endo/Heme/Allergies: Negative.   Psychiatric/Behavioral: Negative.         Medical History: Past Medical History      Past Medical History:  Diagnosis Date   Anemia     GERD (gastroesophageal reflux disease)             Patient Active Problem List  Diagnosis   Neoplasm of uncertain behavior of thyroid gland   Right thyroid nodule      Past Surgical History       Past Surgical History:  Procedure Laterality Date   fibroid            Allergies  No Known Allergies           Current Outpatient Medications on File Prior to Visit  Medication Sig Dispense Refill   famotidine (PEPCID) 20 MG tablet Take 1 tablet by mouth once daily       FUSION PLUS 130 mg iron -1,250 mcg Cap Take 1 capsule by mouth once daily       cabergoline (DOSTINEX) 0.5 mg tablet 1 TABLET ORALLY TWICE A WEEK 90 DAYS       cholecalciferol (VITAMIN D3) 1000 unit tablet Take by mouth        No current facility-administered medications on file prior to visit.      Family History  History reviewed. No pertinent family history.      Social History       Tobacco Use  Smoking Status Never  Smokeless  Tobacco Never      Social History  Social History        Socioeconomic History   Marital status: Single  Tobacco Use   Smoking status: Never   Smokeless tobacco: Never  Vaping Use   Vaping Use: Never used  Substance and Sexual Activity   Alcohol use: Never   Drug use: Never        Objective:         Vitals:    03/29/22 1637  BP: 112/60  Pulse: 91  Temp: 36.7 C (98.1 F)  SpO2: 99%  Weight: 63.5 kg (140 lb)  Height: 167.6 cm ('5\' 6"'$ )    Body mass index is 22.6 kg/m.   Physical Exam    GENERAL APPEARANCE Comfortable, no acute issues Development: normal Gross deformities: none   SKIN Rash, lesions, ulcers: none Induration, erythema:  none Nodules: none palpable   EYES Conjunctiva and lids: normal Pupils: equal and reactive   EARS, NOSE, MOUTH, THROAT External ears: no lesion or deformity External nose: no lesion or deformity Hearing: grossly normal   NECK Symmetric: yes Trachea: midline Thyroid: Left thyroid lobe is normal to palpation.  Right thyroid lobe contains a dominant nodule in the inferior pole which is smooth, mobile, measuring approximately 2.5 cm in dimension, and nontender.  There is no associated lymphadenopathy.   CHEST Respiratory effort: normal Retraction or accessory muscle use: no Breath sounds: normal bilaterally Rales, rhonchi, wheeze: none   CARDIOVASCULAR Auscultation: regular rhythm, normal rate Murmurs: none Pulses: radial pulse 2+ palpable Lower extremity edema: none   ABDOMEN Not assessed   GENITOURINARY/RECTAL Not assessed   MUSCULOSKELETAL Station and gait: normal Digits and nails: no clubbing or cyanosis Muscle strength: grossly normal all extremities Range of motion: grossly normal all extremities Deformity: none   LYMPHATIC Cervical: none palpable Supraclavicular: none palpable   PSYCHIATRIC Oriented to person, place, and time: yes Mood and affect: normal for situation Judgment and insight: appropriate for situation       Assessment and Plan:  Diagnoses and all orders for this visit:   Neoplasm of uncertain behavior of thyroid gland   Right thyroid nodule     Patient is referred by her primary care physician for surgical evaluation and management of a newly identified 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   Patient provided with a copy of "The Thyroid Book: Medical and Surgical Treatment of Thyroid Problems", published by [employer], 16 pages.  Book reviewed and explained to patient during visit today.   The patient and I reviewed her clinical history, her ultrasound report, her cytopathology results, and her  molecular genetic testing.  She has a 2.8 cm nodule in the right thyroid lobe.  I have recommended proceeding with right thyroid lobectomy for definitive diagnosis and management.  We discussed the procedure.  We discussed the alternative of total thyroidectomy.  We discussed the risk and benefits of surgery including the risk of recurrent laryngeal nerve injury and injury to parathyroid glands.  We discussed the size and location of the surgical incision.  We discussed the hospital stay to be anticipated.  We discussed her postoperative recovery and return to work and activities.  We discussed the potential need for additional surgery.  We discussed the potential need for radioactive iodine.  We discussed the potential need for lifelong thyroid hormone replacement.  The patient understands and wishes to proceed with thyroid lobectomy in the near future.  Armandina Gemma, [facility]  A [facility] practice Office: [phone]

## 2022-04-07 ENCOUNTER — Other Ambulatory Visit: Payer: Self-pay | Admitting: Internal Medicine

## 2022-04-10 ENCOUNTER — Encounter: Payer: BC Managed Care – PPO | Admitting: Internal Medicine

## 2022-04-13 ENCOUNTER — Ambulatory Visit (INDEPENDENT_AMBULATORY_CARE_PROVIDER_SITE_OTHER): Payer: BC Managed Care – PPO | Admitting: Internal Medicine

## 2022-04-13 ENCOUNTER — Encounter: Payer: Self-pay | Admitting: Internal Medicine

## 2022-04-13 VITALS — BP 116/78 | HR 78 | Temp 98.6°F | Ht 65.0 in | Wt 136.8 lb

## 2022-04-13 DIAGNOSIS — Z6822 Body mass index (BMI) 22.0-22.9, adult: Secondary | ICD-10-CM

## 2022-04-13 DIAGNOSIS — E041 Nontoxic single thyroid nodule: Secondary | ICD-10-CM | POA: Diagnosis not present

## 2022-04-13 DIAGNOSIS — E221 Hyperprolactinemia: Secondary | ICD-10-CM | POA: Diagnosis not present

## 2022-04-13 DIAGNOSIS — Z0000 Encounter for general adult medical examination without abnormal findings: Secondary | ICD-10-CM

## 2022-04-13 DIAGNOSIS — Z23 Encounter for immunization: Secondary | ICD-10-CM

## 2022-04-13 NOTE — Patient Instructions (Signed)

## 2022-04-13 NOTE — Progress Notes (Signed)
Rich Brave [place],acting as a Education administrator for Maximino Greenland, MD.,have documented all relevant documentation on the behalf of Maximino Greenland, MD,as directed by  Maximino Greenland, MD while in the presence of Maximino Greenland, MD.   Subjective:     Patient ID: Rebecca [name] , female    DOB: 05/28/1971 , 51 y.o.   MRN: [id number]   Chief Complaint  Patient presents with   Annual Exam    HPI  Patient presents today for her physical. She is followed by Dr.Roberts for her GYN care. Patient has no concerns or questions at this time.  She has been      Past Medical History:  Diagnosis Date   Iron deficiency anemia    Leiomyoma      Family History  Problem Relation Age of Onset   Lymphoma Mother    Prostate cancer Father    Breast cancer Neg Hx      Current Outpatient Medications:    cabergoline (DOSTINEX) 0.5 MG tablet, Take 0.5 mg by mouth 2 (two) times a week., Disp: , Rfl:    cholecalciferol (VITAMIN D3) 25 MCG (1000 UNIT) tablet, Take 1,000 Units by mouth daily., Disp: , Rfl:    famotidine (PEPCID) 20 MG tablet, TAKE 1 TABLET BY MOUTH EVERY DAY, Disp: 30 tablet, Rfl: 1   fluticasone (FLONASE) 50 MCG/ACT nasal spray, PLACE 1 SPRAY INTO BOTH NOSTRILS EVERY DAY AS NEEDED., Disp: 16 mL, Rfl: 1   Iron-FA-B Cmp-C-Biot-Probiotic (FUSION PLUS) CAPS, TAKE 1 CAPSULE BY MOUTH EVERY DAY, Disp: 30 capsule, Rfl: 1   No Known Allergies    The patient states she uses none for birth control. Last LMP was Patient's last menstrual period was 03/31/2022.. Negative for Dysmenorrhea. Negative for: breast discharge, breast lump(s), breast pain and breast self exam. Associated symptoms include abnormal vaginal bleeding. Pertinent negatives include abnormal bleeding (hematology), anxiety, decreased libido, depression, difficulty falling sleep, dyspareunia, history of infertility, nocturia, sexual dysfunction, sleep disturbances, urinary incontinence, urinary urgency, vaginal discharge and vaginal  itching. Diet regular.The patient states her exercise level is    . The patient's tobacco use is:  Social History   Tobacco Use  Smoking Status Never  Smokeless Tobacco Never  . She has been exposed to passive smoke. The patient's alcohol use is:  Social History   Substance and Sexual Activity  Alcohol Use Never   Review of Systems  Constitutional: Negative.   HENT: Negative.    Eyes: Negative.   Respiratory: Negative.    Cardiovascular: Negative.   Gastrointestinal: Negative.   Endocrine: Negative.   Genitourinary: Negative.   Musculoskeletal: Negative.   Skin: Negative.   Allergic/Immunologic: Negative.   Neurological: Negative.   Hematological: Negative.   Psychiatric/Behavioral: Negative.       Today's Vitals   04/13/22 1524  BP: 116/78  Pulse: 78  Temp: 98.6 F (37 C)  Weight: 136 lb 12.8 oz (62.1 kg)  Height: '5\' 5"'  (1.651 m)  PainSc: 0-No pain   Body mass index is 22.76 kg/m.  Wt Readings from Last 3 Encounters:  04/13/22 136 lb 12.8 oz (62.1 kg)  02/27/22 137 lb 9.6 oz (62.4 kg)  08/28/21 131 lb (59.4 kg)     Objective:  Physical Exam Vitals and nursing note reviewed.  Constitutional:      Appearance: Normal appearance.  HENT:     Head: Normocephalic and atraumatic.     Right Ear: Tympanic membrane, ear canal and external ear normal.  Left Ear: Tympanic membrane, ear canal and external ear normal.     Nose:     Comments: Masked     Mouth/Throat:     Comments: Masked  Eyes:     Extraocular Movements: Extraocular movements intact.     Conjunctiva/sclera: Conjunctivae normal.     Pupils: Pupils are equal, round, and reactive to light.  Neck:     Thyroid: Thyroid mass present. No thyroid tenderness.     Comments: Nodule on right lobe Cardiovascular:     Rate and Rhythm: Normal rate and regular rhythm.     Pulses: Normal pulses.     Heart sounds: Normal heart sounds.  Pulmonary:     Effort: Pulmonary effort is normal.     Breath sounds:  Normal breath sounds.  Chest:  Breasts:    Tanner Score is 5.     Right: Normal.     Left: Normal.  Abdominal:     General: Abdomen is flat. Bowel sounds are normal.     Palpations: Abdomen is soft.  Genitourinary:    Comments: deferred Musculoskeletal:        General: Normal range of motion.     Cervical back: Normal range of motion and neck supple.  Skin:    General: Skin is warm and dry.  Neurological:     General: No focal deficit present.     Mental Status: She is alert and oriented to person, place, and time.  Psychiatric:        Mood and Affect: Mood normal.        Behavior: Behavior normal.      Assessment And Plan:     1. Encounter for general adult medical examination w/o abnormal findings Comments: A full exam was performed. Importance of monthly self breast exams was discussed with the patient. She is up to date with mammogram/CRC screening. PATIENT IS ADVISED TO GET 30-45 MINUTES REGULAR EXERCISE NO LESS THAN FOUR TO FIVE DAYS PER WEEK - BOTH WEIGHTBEARING EXERCISES AND AEROBIC ARE RECOMMENDED.  PATIENT IS ADVISED TO FOLLOW A HEALTHY DIET WITH AT LEAST SIX FRUITS/VEGGIES PER DAY, DECREASE INTAKE OF RED MEAT, AND TO INCREASE FISH INTAKE TO TWO DAYS PER WEEK.  MEATS/FISH SHOULD NOT BE FRIED, BAKED OR BROILED IS PREFERABLE.  IT IS ALSO IMPORTANT TO CUT BACK ON YOUR SUGAR INTAKE. PLEASE AVOID ANYTHING WITH ADDED SUGAR, CORN SYRUP OR OTHER SWEETENERS. IF YOU MUST USE A SWEETENER, YOU CAN TRY STEVIA. IT IS ALSO IMPORTANT TO AVOID ARTIFICIALLY SWEETENERS AND DIET BEVERAGES. LASTLY, I SUGGEST WEARING SPF 50 SUNSCREEN ON EXPOSED PARTS AND ESPECIALLY WHEN IN THE DIRECT SUNLIGHT FOR AN EXTENDED PERIOD OF TIME.  PLEASE AVOID FAST FOOD RESTAURANTS AND INCREASE YOUR WATER INTAKE. - CMP14+EGFR - Lipid panel  2. 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 This has been stable. She will c/w cabergoline 0.36m twice weekly as per Endo.   3. Thyroid  nodule Comments: She is s/p biopsy, deemed suspicious by Afirma. She is scheduled for right thyroid lobectomy on 05/10/22. Surgical consult reviewed.   4. BMI 22.0-22.9, adult Comments: She is encouraged to aim for at least 150 minutes of exercise per week.   5. Immunization due - Flu Vaccine QUAD 6+ mos PF IM (Fluarix Quad PF)  Patient was given opportunity to ask questions. Patient verbalized understanding of the plan and was able to repeat key elements of the plan. All questions were answered to their satisfaction.   I, RMaximino Greenland MD,  have reviewed all documentation for this visit. The documentation on 04/13/22 for the exam, diagnosis, procedures, and orders are all accurate and complete.   THE PATIENT IS ENCOURAGED TO PRACTICE SOCIAL DISTANCING DUE TO THE COVID-19 PANDEMIC.

## 2022-04-14 LAB — CMP14+EGFR
ALT: 9 IU/L (ref 0–32)
AST: 12 IU/L (ref 0–40)
Albumin/Globulin Ratio: 2 (ref 1.2–2.2)
Albumin: 4.7 g/dL (ref 3.8–4.9)
Alkaline Phosphatase: 58 IU/L (ref 44–121)
BUN/Creatinine Ratio: 12 (ref 9–23)
BUN: 12 mg/dL (ref 6–24)
Bilirubin Total: 0.4 mg/dL (ref 0.0–1.2)
CO2: 24 mmol/L (ref 20–29)
Calcium: 9.9 mg/dL (ref 8.7–10.2)
Chloride: 105 mmol/L (ref 96–106)
Creatinine, Ser: 1.03 mg/dL — ABNORMAL HIGH (ref 0.57–1.00)
Globulin, Total: 2.3 g/dL (ref 1.5–4.5)
Glucose: 87 mg/dL (ref 70–99)
Potassium: 4.4 mmol/L (ref 3.5–5.2)
Sodium: 142 mmol/L (ref 134–144)
Total Protein: 7 g/dL (ref 6.0–8.5)
eGFR: 66 mL/min/{1.73_m2} (ref >59)

## 2022-04-14 LAB — LIPID PANEL
Chol/HDL Ratio: 2.7 ratio (ref 0.0–4.4)
Cholesterol, Total: 156 mg/dL (ref 100–199)
HDL: 58 mg/dL (ref >39)
LDL Chol Calc (NIH): 82 mg/dL (ref 0–99)
Triglycerides: 86 mg/dL (ref 0–149)
VLDL Cholesterol Cal: 16 mg/dL (ref 5–40)

## 2022-04-16 ENCOUNTER — Other Ambulatory Visit: Payer: Self-pay | Admitting: Internal Medicine

## 2022-04-26 NOTE — Progress Notes (Addendum)
COVID Vaccine received:  '[]'$  No '[x]'$  Yes Date of any COVID positive Test in last 90 days: none  PCP - Glendale Chard, MD  [facility] [phone] Work [phone]  Fax  Cardiologist -  [place] - Dr. Leim Fabry Phy  Chest x-ray - n/a EKG -  no pertinent medical history Stress Test - n/a ECHO - n/a Cardiac Cath - n/a  Pacemaker/ICD device     '[x]'$  N/A Spinal Cord Stimulator:'[x]'$  No '[]'$  Yes      (Remind patient to bring remote DOS) Other Implants:   History of Sleep Apnea? '[x]'$  No '[]'$  Yes   Sleep Study Date:   CPAP used?- '[x]'$  No '[]'$  Yes  (Instruct to bring their mask & Tubing)  Does the patient monitor blood sugar? '[]'$  No '[]'$  Yes  '[x]'$  N/A  Blood Thinner Instructions:  none Aspirin Instructions: Last Dose:  ERAS Protocol Ordered: '[]'$  No  '[x]'$  Yes  No drink order  Comments:   Activity level: Patient can climb a flight of stairs without difficulty;  '[x]'$  No CP  '[x]'$  No SOB  Anesthesia review: anemia  Patient denies shortness of breath, fever, cough and chest pain at PAT appointment.  Patient verbalized understanding and agreement to the Pre-Surgical Instructions that were given to them at this PAT appointment. Patient was also educated of the need to review these PAT instructions again prior to his/her surgery.I reviewed the appropriate phone numbers to call if they have any and questions or concerns.

## 2022-04-26 NOTE — Patient Instructions (Addendum)
SURGICAL WAITING ROOM VISITATION Patients having surgery or a procedure may have no more than 2 support people in the waiting area - these visitors may rotate in the visitor waiting room.   Children under the age of 27 must have an adult with them who is not the patient. If the patient needs to stay at the hospital during part of their recovery, the visitor guidelines for inpatient rooms apply.  PRE-OP VISITATION  Pre-op nurse will coordinate an appropriate time for 1 support person to accompany the patient in pre-op.  This support person may not rotate.  This visitor will be contacted when the time is appropriate for the visitor to come back in the pre-op area.  Please refer to the [facility] website for the visitor guidelines for Inpatients (after your surgery is over and you are in a regular room).  You are not required to quarantine at this time prior to your surgery. However, you must do this: Hand Hygiene often Do NOT share personal items Notify your provider if you are in close contact with someone who has COVID or you develop fever 100.4 or greater, new onset of sneezing, cough, sore throat, shortness of breath or body aches.   If you received a COVID test during your pre-op visit  it is requested that you wear a mask when out in public, stay away from anyone that may not be feeling well and notify your surgeon if you develop symptoms. If you test positive for Covid or have been in contact with anyone that has tested positive in the last 10 days please notify you surgeon.       Your procedure is scheduled on:  Thursday May 10, 2022  Report to [facility] Main Entrance.  Report to admitting at: 05:15 am  +++++Call this number if you have any questions or problems the morning of surgery [phone]  Do not eat food :After Midnight the night prior to your surgery/procedure.  After Midnight you may have the following liquids until  04:30  AM DAY OF SURGERY  Clear  Liquid Diet Water Black Coffee (sugar ok, NO MILK/CREAM OR CREAMERS)  Tea (sugar ok, NO MILK/CREAM OR CREAMERS) regular and decaf                             Plain Jell-O (NO RED)                                           Fruit ices (not with fruit pulp, NO RED)                                     Popsicles (NO RED)                                                                  Juice: apple, WHITE grape, WHITE cranberry Sports drinks like Gatorade (NO RED)                  FOLLOW ANY ADDITIONAL PRE OP INSTRUCTIONS  YOU RECEIVED FROM YOUR SURGEON'S OFFICE!!!   Oral Hygiene is also important to reduce your risk of infection.        Remember - BRUSH YOUR TEETH THE MORNING OF SURGERY WITH YOUR REGULAR TOOTHPASTE   Take ONLY these medicines the morning of surgery with A SIP OF WATER: No medicines but you may use your FLONASE nasal spray if needed.                  You may not have any metal on your body including hair pins, jewelry, and body piercing  Do not wear make-up, lotions, powders, perfumes, or deodorant  Do not wear nail polish including gel and S&S, artificial / acrylic nails, or any other type of covering on natural nails including finger and toenails. If you have artificial nails, gel coating, etc., that needs to be removed by a nail salon, Please have this removed prior to surgery. Not doing so may mean that your surgery could be cancelled or delayed if the Surgeon or anesthesia staff feels like they are unable to monitor you safely.   Do not shave 48 hours prior to surgery to avoid nicks in your skin which may contribute to postoperative infections.   You may bring a small overnight bag with you on the day of surgery, only pack items that are not valuable .[facility] IS NOT RESPONSIBLE   FOR VALUABLES THAT ARE LOST OR STOLEN.   DO NOT [place]. PHARMACY WILL DISPENSE MEDICATIONS LISTED ON YOUR MEDICATION LIST TO YOU DURING YOUR ADMISSION  [place]!   Special Instructions: Bring a copy of your healthcare power of attorney and living will documents the day of surgery, if you wish to have them scanned into your [place] Medical Records- EPIC  Please read over the following fact sheets you were given: IF YOU HAVE QUESTIONS ABOUT YOUR PRE-OP INSTRUCTIONS, PLEASE CALL [phone]  ([place])   [facility] - Preparing for Surgery Before surgery, you can play an important role.  Because skin is not sterile, your skin needs to be as free of germs as possible.  You can reduce the number of germs on your skin by washing with CHG (chlorahexidine gluconate) soap before surgery.  CHG is an antiseptic cleaner which kills germs and bonds with the skin to continue killing germs even after washing. Please DO NOT use if you have an allergy to CHG or antibacterial soaps.  If your skin becomes reddened/irritated stop using the CHG and inform your nurse when you arrive at Short Stay. Do not shave (including legs and underarms) for at least 48 hours prior to the first CHG shower.  You may shave your face/neck.  Please follow these instructions carefully:  1.  Shower with CHG Soap the night before surgery and the  morning of surgery.  2.  If you choose to wash your hair, wash your hair first as usual with your normal  shampoo.  3.  After you shampoo, rinse your hair and body thoroughly to remove the shampoo.                             4.  Use CHG as you would any other liquid soap.  You can apply chg directly to the skin and wash.  Gently with a scrungie or clean washcloth.  5.  Apply the CHG Soap to your body ONLY FROM THE NECK DOWN.  Do not use on face/ open                           Wound or open sores. Avoid contact with eyes, ears mouth and genitals (private parts).                       Wash face,  Genitals (private parts) with your normal soap.             6.  Wash thoroughly, paying special attention to the area where your  surgery  will  be performed.  7.  Thoroughly rinse your body with warm water from the neck down.  8.  DO NOT shower/wash with your normal soap after using and rinsing off the CHG Soap.            9.  Pat yourself dry with a clean towel.            10.  Wear clean pajamas.            11.  Place clean sheets on your bed the night of your first shower and do not  sleep with pets.  ON THE DAY OF SURGERY : Do not apply any lotions/deodorants the morning of surgery.  Please wear clean clothes to the hospital/surgery center.    FAILURE TO FOLLOW THESE INSTRUCTIONS MAY RESULT IN THE CANCELLATION OF YOUR SURGERY  PATIENT SIGNATURE_________________________________  NURSE SIGNATURE__________________________________  ________________________________________________________________________

## 2022-04-27 ENCOUNTER — Other Ambulatory Visit: Payer: Self-pay

## 2022-04-27 ENCOUNTER — Encounter (HOSPITAL_COMMUNITY): Payer: Self-pay

## 2022-04-27 ENCOUNTER — Encounter (HOSPITAL_COMMUNITY)
Admission: RE | Admit: 2022-04-27 | Discharge: 2022-04-27 | Disposition: A | Discharge status: Home / Self Care | Payer: BC Managed Care – PPO | Source: Ambulatory Visit | Attending: Surgery | Admitting: Surgery

## 2022-04-27 VITALS — BP 115/90 | HR 70 | Temp 98.4°F | Resp 16 | Ht 66.0 in | Wt 135.0 lb

## 2022-04-27 DIAGNOSIS — D649 Anemia, unspecified: Secondary | ICD-10-CM | POA: Diagnosis not present

## 2022-04-27 DIAGNOSIS — Z01818 Encounter for other preprocedural examination: Secondary | ICD-10-CM | POA: Diagnosis not present

## 2022-04-27 LAB — CBC
HCT: 36.8 % (ref 36.0–46.0)
Hemoglobin: 12.3 g/dL (ref 12.0–15.0)
MCH: 28.7 pg (ref 26.0–34.0)
MCHC: 33.4 g/dL (ref 30.0–36.0)
MCV: 86 fL (ref 80.0–100.0)
Platelets: 328 10*3/uL (ref 150–400)
RBC: 4.28 MIL/uL (ref 3.87–5.11)
RDW: 12.8 % (ref 11.5–15.5)
WBC: 7.7 10*3/uL (ref 4.0–10.5)
nRBC: 0 % (ref 0.0–0.2)

## 2022-05-07 ENCOUNTER — Encounter (HOSPITAL_COMMUNITY): Payer: Self-pay | Admitting: Surgery

## 2022-05-07 DIAGNOSIS — E041 Nontoxic single thyroid nodule: Secondary | ICD-10-CM | POA: Diagnosis present

## 2022-05-07 DIAGNOSIS — D440 Neoplasm of uncertain behavior of thyroid gland: Secondary | ICD-10-CM | POA: Diagnosis present

## 2022-05-07 NOTE — H&P (Signed)
REFERRING PHYSICIAN: Glendale Chard MD  PROVIDER: Anyi Fels Charlotta Newton, MD   Chief Complaint: New Consultation (Right thyroid nodule)  History of Present Illness:  Patient is referred referred by Dr. 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 Patient had undergone an ultrasound examination ordered by her gynecologist, Dr. Everett Graff. This demonstrated a normal-sized thyroid gland with a solitary 2.8 cm nodule in the right thyroid lobe. Ultrasound-guided fine-needle aspiration biopsy was performed on September 12, 2021. This demonstrated atypia of undetermined significance, Bethesda category III. Specimen was subsequently submitted for molecular genetic testing, AFIRMA, and returned with a result of suspicious. DNA testing revealed a mutation increasing the risk of malignancy to approximately 75%. Patient is therefore referred to surgery for resection for definitive diagnosis and management. Patient has no prior history of thyroid disease. She has never been on thyroid medication. Recent TSH level is normal at 2.12. There is no family history of thyroid disease, and specifically there is no family history of thyroid cancer. Has had no prior head or neck surgery. Patient has been evaluated by her primary care physician, her gynecologist, and her endocrinologist, Dr. Dagmar Hait. Patient presents today to discuss thyroid surgery.  Review of Systems: A complete review of systems was obtained from the patient. I have reviewed this information and discussed as appropriate with the patient. See HPI as well for other ROS.  Review of Systems  Constitutional: Negative.  HENT: Negative.  Eyes: Negative.  Respiratory: Negative.  Cardiovascular: Negative.  Gastrointestinal: Negative.  Genitourinary: Negative.  Musculoskeletal: Negative.  Skin: Negative.  Neurological: Negative.   Endo/Heme/Allergies: Negative.  Psychiatric/Behavioral: Negative.   Medical History: Past Medical History:  Diagnosis Date  Anemia  GERD (gastroesophageal reflux disease)   Patient Active Problem List  Diagnosis  Neoplasm of uncertain behavior of thyroid gland  Right thyroid nodule   Past Surgical History:  Procedure Laterality Date  fibroid    No Known Allergies  Current Outpatient Medications on File Prior to Visit  Medication Sig Dispense Refill  famotidine (PEPCID) 20 MG tablet Take 1 tablet by mouth once daily  FUSION PLUS 130 mg iron -1,250 mcg Cap Take 1 capsule by mouth once daily  cabergoline (DOSTINEX) 0.5 mg tablet 1 TABLET ORALLY TWICE A WEEK 90 DAYS  cholecalciferol (VITAMIN D3) 1000 unit tablet Take by mouth   No current facility-administered medications on file prior to visit.   History reviewed. No pertinent family history.   Social History   Tobacco Use  Smoking Status Never  Smokeless Tobacco Never    Social History   Socioeconomic History  Marital status: Single  Tobacco Use  Smoking status: Never  Smokeless tobacco: Never  Vaping Use  Vaping Use: Never used  Substance and Sexual Activity  Alcohol use: Never  Drug use: Never   Objective:   Vitals:  BP: 112/60  Pulse: 91  Temp: 36.7 C (98.1 F)  SpO2: 99%  Weight: 63.5 kg (140 lb)  Height: 167.6 cm ('5\' 6"'$ )   Body mass index is 22.6 kg/m.  Physical Exam   GENERAL APPEARANCE Comfortable, no acute issues Development: normal Gross deformities: none  SKIN Rash, lesions, ulcers: none Induration, erythema: none Nodules: none palpable  EYES Conjunctiva and lids: normal Pupils: equal and reactive  EARS, NOSE, MOUTH, THROAT External ears: no lesion or deformity External nose: no lesion or deformity Hearing: grossly normal  NECK Symmetric: yes Trachea: midline Thyroid:  Left thyroid lobe is normal to palpation. Right thyroid lobe contains a dominant nodule in the  inferior pole which is smooth, mobile, measuring approximately 2.5 cm in dimension, and nontender. There is no associated lymphadenopathy.  CHEST Respiratory effort: normal Retraction or accessory muscle use: no Breath sounds: normal bilaterally Rales, rhonchi, wheeze: none  CARDIOVASCULAR Auscultation: regular rhythm, normal rate Murmurs: none Pulses: radial pulse 2+ palpable Lower extremity edema: none  ABDOMEN Not assessed  GENITOURINARY/RECTAL Not assessed  MUSCULOSKELETAL Station and gait: normal Digits and nails: no clubbing or cyanosis Muscle strength: grossly normal all extremities Range of motion: grossly normal all extremities Deformity: none  LYMPHATIC Cervical: none palpable Supraclavicular: none palpable  PSYCHIATRIC Oriented to person, place, and time: yes Mood and affect: normal for situation Judgment and insight: appropriate for situation   Assessment and Plan:   Neoplasm of uncertain behavior of thyroid gland  Right thyroid nodule  Patient is referred by her primary care physician for surgical evaluation and management of a newly identified 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  Patient provided with a copy of "The Thyroid Book: Medical and Surgical Treatment of Thyroid Problems", published by [employer], 16 pages. Book reviewed and explained to patient during visit today.  The patient and I reviewed her clinical history, her ultrasound report, her cytopathology results, and her molecular genetic testing. She has a 2.8 cm nodule in the right thyroid lobe. I have recommended proceeding with right thyroid lobectomy for definitive diagnosis and management. We discussed the procedure. We discussed the alternative of total thyroidectomy. We discussed the risk and benefits of surgery including the risk of recurrent laryngeal nerve injury and injury to parathyroid glands. We discussed the size and location of the surgical  incision. We discussed the hospital stay to be anticipated. We discussed her postoperative recovery and return to work and activities. We discussed the potential need for additional surgery. We discussed the potential need for radioactive iodine. We discussed the potential need for lifelong thyroid hormone replacement. The patient understands and wishes to proceed with thyroid lobectomy in the near future.  Armandina Gemma, MD [facility] [place] practice Office: [phone]

## 2022-05-09 NOTE — Anesthesia Preprocedure Evaluation (Addendum)
Anesthesia Evaluation  Patient identified by MRN, date of birth, ID band Patient awake    Reviewed: Allergy & Precautions, H&P , NPO status , Patient's Chart, lab work & pertinent test results, reviewed documented beta blocker date and time   Airway Mallampati: I  TM Distance: >3 FB Neck ROM: full    Dental no notable dental hx. (+) Teeth Intact, Dental Advisory Given   Pulmonary neg pulmonary ROS,    Pulmonary exam normal breath sounds clear to auscultation       Cardiovascular Exercise Tolerance: Good negative cardio ROS   Rhythm:regular Rate:Normal     Neuro/Psych negative neurological ROS  negative psych ROS   GI/Hepatic negative GI ROS, Neg liver ROS,   Endo/Other  negative endocrine ROS  Renal/GU negative Renal ROS  negative genitourinary   Musculoskeletal negative musculoskeletal ROS (+)   Abdominal   Peds negative pediatric ROS (+)  Hematology negative hematology ROS (+) Blood dyscrasia, anemia ,   Anesthesia Other Findings   Reproductive/Obstetrics negative OB ROS                            Anesthesia Physical  Anesthesia Plan  ASA: 2  Anesthesia Plan: General   Post-op Pain Management: Tylenol PO (pre-op)* and Celebrex PO (pre-op)*   Induction: Intravenous  PONV Risk Score and Plan: 3 and Ondansetron, Dexamethasone and Treatment may vary due to age or medical condition  Airway Management Planned: Oral ETT  Additional Equipment: None  Intra-op Plan:   Post-operative Plan: Extubation in OR  Informed Consent: I have reviewed the patients History and Physical, chart, labs and discussed the procedure including the risks, benefits and alternatives for the proposed anesthesia with the patient or authorized representative who has indicated his/her understanding and acceptance.     Dental Advisory Given  Plan Discussed with: CRNA and Anesthesiologist  Anesthesia  Plan Comments: (  )        Anesthesia Quick Evaluation

## 2022-05-10 ENCOUNTER — Ambulatory Visit (HOSPITAL_COMMUNITY)
Admission: RE | Admit: 2022-05-10 | Discharge: 2022-05-11 | Disposition: A | Discharge status: Home / Self Care | Payer: BC Managed Care – PPO | Attending: Surgery | Admitting: Surgery

## 2022-05-10 ENCOUNTER — Encounter (HOSPITAL_COMMUNITY): Payer: Self-pay | Admitting: Surgery

## 2022-05-10 ENCOUNTER — Ambulatory Visit (HOSPITAL_COMMUNITY): Payer: BC Managed Care – PPO | Admitting: Certified Registered Nurse Anesthetist

## 2022-05-10 ENCOUNTER — Other Ambulatory Visit: Payer: Self-pay

## 2022-05-10 ENCOUNTER — Encounter (HOSPITAL_COMMUNITY)
Admission: RE | Disposition: A | Discharge status: Home / Self Care | Payer: Self-pay | Source: Home / Self Care | Attending: Surgery

## 2022-05-10 DIAGNOSIS — D440 Neoplasm of uncertain behavior of thyroid gland: Secondary | ICD-10-CM | POA: Insufficient documentation

## 2022-05-10 DIAGNOSIS — E041 Nontoxic single thyroid nodule: Secondary | ICD-10-CM | POA: Diagnosis present

## 2022-05-10 DIAGNOSIS — Z01818 Encounter for other preprocedural examination: Secondary | ICD-10-CM

## 2022-05-10 HISTORY — PX: THYROID LOBECTOMY: SHX420

## 2022-05-10 LAB — POCT PREGNANCY, URINE: Preg Test, Ur: NEGATIVE

## 2022-05-10 SURGERY — LOBECTOMY, THYROID
Anesthesia: General | Site: Neck | Laterality: Right

## 2022-05-10 MED ORDER — ORAL CARE MOUTH RINSE: 15.0000 mL | Freq: Once | OROMUCOSAL | Status: AC

## 2022-05-10 MED ORDER — ACETAMINOPHEN 325 MG PO TABS: 325.0000 mg | ORAL_TABLET | ORAL | Status: DC | PRN

## 2022-05-10 MED ORDER — TRAMADOL HCL 50 MG PO TABS: 50.0000 mg | ORAL_TABLET | Freq: Four times a day (QID) | ORAL | Status: DC | PRN

## 2022-05-10 MED ORDER — 0.9 % SODIUM CHLORIDE (POUR BTL) OPTIME
TOPICAL | Status: DC | PRN
  Administered 2022-05-10: 1000 mL

## 2022-05-10 MED ORDER — OXYCODONE HCL 5 MG PO TABS: 5.0000 mg | ORAL_TABLET | Freq: Once | ORAL | Status: DC | PRN

## 2022-05-10 MED ORDER — OXYCODONE HCL 5 MG/5ML PO SOLN: 5.0000 mg | Freq: Once | ORAL | Status: DC | PRN

## 2022-05-10 MED ORDER — ONDANSETRON HCL 4 MG/2ML IJ SOLN: 4.0000 mg | Freq: Four times a day (QID) | INTRAMUSCULAR | Status: DC | PRN

## 2022-05-10 MED ORDER — ONDANSETRON HCL 4 MG/2ML IJ SOLN: 4.0000 mg | Freq: Once | INTRAMUSCULAR | Status: DC | PRN

## 2022-05-10 MED ORDER — PROPOFOL 10 MG/ML IV BOLUS
INTRAVENOUS | Status: AC
  Filled 2022-05-10: qty 20

## 2022-05-10 MED ORDER — LACTATED RINGERS IV SOLN: INTRAVENOUS | Status: DC

## 2022-05-10 MED ORDER — LIDOCAINE 2% (20 MG/ML) 5 ML SYRINGE
INTRAMUSCULAR | Status: DC | PRN
  Administered 2022-05-10: 80 mg via INTRAVENOUS

## 2022-05-10 MED ORDER — ONDANSETRON HCL 4 MG/2ML IJ SOLN
INTRAMUSCULAR | Status: AC
  Filled 2022-05-10: qty 2

## 2022-05-10 MED ORDER — SODIUM CHLORIDE 0.45 % IV SOLN: INTRAVENOUS | Status: DC

## 2022-05-10 MED ORDER — ACETAMINOPHEN 650 MG RE SUPP: 650.0000 mg | Freq: Four times a day (QID) | RECTAL | Status: DC | PRN

## 2022-05-10 MED ORDER — ACETAMINOPHEN 500 MG PO TABS
1000.0000 mg | ORAL_TABLET | Freq: Once | ORAL | Status: AC
  Administered 2022-05-10: 1000 mg via ORAL
  Filled 2022-05-10: qty 2

## 2022-05-10 MED ORDER — ACETAMINOPHEN 160 MG/5ML PO SOLN: 325.0000 mg | ORAL | Status: DC | PRN

## 2022-05-10 MED ORDER — MIDAZOLAM HCL 5 MG/5ML IJ SOLN
INTRAMUSCULAR | Status: DC | PRN
  Administered 2022-05-10: 2 mg via INTRAVENOUS

## 2022-05-10 MED ORDER — CHLORHEXIDINE GLUCONATE 0.12 % MT SOLN
15.0000 mL | Freq: Once | OROMUCOSAL | Status: AC
  Administered 2022-05-10: 15 mL via OROMUCOSAL

## 2022-05-10 MED ORDER — ONDANSETRON 4 MG PO TBDP: 4.0000 mg | ORAL_TABLET | Freq: Four times a day (QID) | ORAL | Status: DC | PRN

## 2022-05-10 MED ORDER — CELECOXIB 200 MG PO CAPS
200.0000 mg | ORAL_CAPSULE | Freq: Once | ORAL | Status: AC
  Administered 2022-05-10: 200 mg via ORAL
  Filled 2022-05-10: qty 1

## 2022-05-10 MED ORDER — FENTANYL CITRATE (PF) 100 MCG/2ML IJ SOLN
INTRAMUSCULAR | Status: AC
  Filled 2022-05-10: qty 2

## 2022-05-10 MED ORDER — CHLORHEXIDINE GLUCONATE CLOTH 2 % EX PADS: 6.0000 | MEDICATED_PAD | Freq: Once | CUTANEOUS | Status: DC

## 2022-05-10 MED ORDER — HYDROMORPHONE HCL 1 MG/ML IJ SOLN: 1.0000 mg | INTRAMUSCULAR | Status: DC | PRN

## 2022-05-10 MED ORDER — MIDAZOLAM HCL 2 MG/2ML IJ SOLN
INTRAMUSCULAR | Status: AC
  Filled 2022-05-10: qty 2

## 2022-05-10 MED ORDER — FENTANYL CITRATE PF 50 MCG/ML IJ SOSY: 25.0000 ug | PREFILLED_SYRINGE | INTRAMUSCULAR | Status: DC | PRN

## 2022-05-10 MED ORDER — SUGAMMADEX SODIUM 200 MG/2ML IV SOLN
INTRAVENOUS | Status: DC | PRN
  Administered 2022-05-10: 200 mg via INTRAVENOUS

## 2022-05-10 MED ORDER — ONDANSETRON HCL 4 MG/2ML IJ SOLN
INTRAMUSCULAR | Status: DC | PRN
  Administered 2022-05-10: 4 mg via INTRAVENOUS

## 2022-05-10 MED ORDER — ROCURONIUM BROMIDE 10 MG/ML (PF) SYRINGE
PREFILLED_SYRINGE | INTRAVENOUS | Status: DC | PRN
  Administered 2022-05-10: 60 mg via INTRAVENOUS

## 2022-05-10 MED ORDER — MEPERIDINE HCL 50 MG/ML IJ SOLN: 6.2500 mg | INTRAMUSCULAR | Status: DC | PRN

## 2022-05-10 MED ORDER — PROPOFOL 10 MG/ML IV BOLUS
INTRAVENOUS | Status: DC | PRN
  Administered 2022-05-10: 150 mg via INTRAVENOUS

## 2022-05-10 MED ORDER — DEXAMETHASONE SODIUM PHOSPHATE 4 MG/ML IJ SOLN
INTRAMUSCULAR | Status: DC | PRN
  Administered 2022-05-10: 5 mg via INTRAVENOUS

## 2022-05-10 MED ORDER — ROCURONIUM BROMIDE 10 MG/ML (PF) SYRINGE
PREFILLED_SYRINGE | INTRAVENOUS | Status: AC
  Filled 2022-05-10: qty 10

## 2022-05-10 MED ORDER — FENTANYL CITRATE (PF) 100 MCG/2ML IJ SOLN
INTRAMUSCULAR | Status: DC | PRN
  Administered 2022-05-10 (×2): 100 ug via INTRAVENOUS

## 2022-05-10 MED ORDER — OXYCODONE HCL 5 MG PO TABS: 5.0000 mg | ORAL_TABLET | ORAL | Status: DC | PRN

## 2022-05-10 MED ORDER — LIDOCAINE HCL (PF) 2 % IJ SOLN
INTRAMUSCULAR | Status: AC
  Filled 2022-05-10: qty 5

## 2022-05-10 MED ORDER — CEFAZOLIN SODIUM-DEXTROSE 2-4 GM/100ML-% IV SOLN
2.0000 g | INTRAVENOUS | Status: AC
  Administered 2022-05-10: 2 g via INTRAVENOUS
  Filled 2022-05-10: qty 100

## 2022-05-10 MED ORDER — FAMOTIDINE 20 MG PO TABS
20.0000 mg | ORAL_TABLET | Freq: Every day | ORAL | Status: DC
  Administered 2022-05-10: 20 mg via ORAL
  Filled 2022-05-10: qty 1

## 2022-05-10 MED ORDER — DEXAMETHASONE SODIUM PHOSPHATE 10 MG/ML IJ SOLN
INTRAMUSCULAR | Status: AC
  Filled 2022-05-10: qty 1

## 2022-05-10 MED ORDER — ACETAMINOPHEN 325 MG PO TABS
650.0000 mg | ORAL_TABLET | Freq: Four times a day (QID) | ORAL | Status: DC | PRN
  Administered 2022-05-10 – 2022-05-11 (×2): 650 mg via ORAL
  Filled 2022-05-10 (×2): qty 2

## 2022-05-10 SURGICAL SUPPLY — 34 items
ADH SKN CLS APL DERMABOND .7 (GAUZE/BANDAGES/DRESSINGS) ×1
ADH SKN CLS LQ APL DERMABOND (GAUZE/BANDAGES/DRESSINGS)
APL PRP STRL LF DISP 70% ISPRP (MISCELLANEOUS) ×1
ATTRACTOMAT 16X20 MAGNETIC DRP (DRAPES) ×1 IMPLANT
BAG COUNTER SPONGE SURGICOUNT (BAG) ×1 IMPLANT
BAG SPNG CNTER NS LX DISP (BAG) ×1
BLADE SURG 15 STRL LF DISP TIS (BLADE) ×1 IMPLANT
BLADE SURG 15 STRL SS (BLADE) ×1
CHLORAPREP W/TINT 26 (MISCELLANEOUS) ×1 IMPLANT
CLIP TI MEDIUM 6 (CLIP) ×2 IMPLANT
CLIP TI WIDE RED SMALL 6 (CLIP) ×2 IMPLANT
COVER SURGICAL LIGHT HANDLE (MISCELLANEOUS) ×1 IMPLANT
DERMABOND ADVANCED .7 DNX12 (GAUZE/BANDAGES/DRESSINGS) ×1 IMPLANT
DERMABOND ADVANCED .7 DNX6 (GAUZE/BANDAGES/DRESSINGS) IMPLANT
DRAPE LAPAROTOMY T 98X78 PEDS (DRAPES) ×1 IMPLANT
DRAPE UTILITY XL STRL (DRAPES) ×1 IMPLANT
ELECT PENCIL ROCKER SW 15FT (MISCELLANEOUS) ×1 IMPLANT
ELECT REM PT RETURN 15FT ADLT (MISCELLANEOUS) ×1 IMPLANT
GAUZE 4X4 16PLY ~~LOC~~+RFID DBL (SPONGE) ×1 IMPLANT
GLOVE SURG ORTHO 8.0 STRL STRW (GLOVE) ×1 IMPLANT
GOWN STRL REUS W/ TWL XL LVL3 (GOWN DISPOSABLE) ×2 IMPLANT
GOWN STRL REUS W/TWL XL LVL3 (GOWN DISPOSABLE) ×2
HEMOSTAT SURGICEL 2X4 FIBR (HEMOSTASIS) ×1 IMPLANT
ILLUMINATOR WAVEGUIDE N/F (MISCELLANEOUS) ×1 IMPLANT
KIT BASIN OR (CUSTOM PROCEDURE TRAY) ×1 IMPLANT
KIT TURNOVER KIT A (KITS) IMPLANT
PACK BASIC VI WITH GOWN DISP (CUSTOM PROCEDURE TRAY) ×1 IMPLANT
SHEARS HARMONIC 9CM CVD (BLADE) ×1 IMPLANT
SUT MNCRL AB 4-0 PS2 18 (SUTURE) ×1 IMPLANT
SUT VIC AB 3-0 SH 18 (SUTURE) ×2 IMPLANT
SYR BULB IRRIG 60ML STRL (SYRINGE) ×1 IMPLANT
TOWEL OR 17X26 10 PK STRL BLUE (TOWEL DISPOSABLE) ×1 IMPLANT
TOWEL OR NON WOVEN STRL DISP B (DISPOSABLE) ×1 IMPLANT
TUBING CONNECTING 10 (TUBING) ×1 IMPLANT

## 2022-05-10 NOTE — Interval H&P Note (Signed)
History and Physical Interval Note:  05/10/2022 7:07 AM  Rebecca [name]  has presented today for surgery, with the diagnosis of THYROID NEOPLASM OF UNCERTAIN BEHAVIOR.  The various methods of treatment have been discussed with the patient and family. After consideration of risks, benefits and other options for treatment, the patient has consented to    Procedure(s): RIGHT THYROID LOBECTOMY (Right) as a surgical intervention.    The patient's history has been reviewed, patient examined, no change in status, stable for surgery.  I have reviewed the patient's chart and labs.  Questions were answered to the patient's satisfaction.    Armandina Gemma, [facility] [place] practice Office: [place]

## 2022-05-10 NOTE — Transfer of Care (Signed)
Immediate Anesthesia Transfer of Care Note  Patient: Rebecca [name]  Procedure(s) Performed: RIGHT THYROID LOBECTOMY (Right)  Patient Location: PACU  Anesthesia Type:General  Level of Consciousness: drowsy and patient cooperative  Airway & Oxygen Therapy: Patient Spontanous Breathing and Patient connected to face mask  Post-op Assessment: Report given to RN and Post -op Vital signs reviewed and stable  Post vital signs: Reviewed and stable  Last Vitals:  Vitals Value Taken Time  BP    Temp    Pulse 74 05/10/22 0851  Resp    SpO2 100 % 05/10/22 0851  Vitals shown include unvalidated device data.  Last Pain:  Vitals:   05/10/22 0607  TempSrc: Oral         Complications: No notable events documented.

## 2022-05-10 NOTE — Anesthesia Procedure Notes (Signed)
Procedure Name: Intubation Date/Time: 05/10/2022 7:30 AM  Performed by: Claudia Desanctis, CRNAPre-anesthesia Checklist: Patient identified, Emergency Drugs available, Suction available and Patient being monitored Patient Re-evaluated:Patient Re-evaluated prior to induction Oxygen Delivery Method: Circle system utilized Preoxygenation: Pre-oxygenation with 100% oxygen Induction Type: IV induction Ventilation: Mask ventilation without difficulty Laryngoscope Size: Miller and 3 Grade View: Grade I Tube type: Oral Number of attempts: 1 Airway Equipment and Method: Stylet Placement Confirmation: ETT inserted through vocal cords under direct vision, positive ETCO2 and breath sounds checked- equal and bilateral Secured at: 21 cm Tube secured with: Tape Dental Injury: Teeth and Oropharynx as per pre-operative assessment

## 2022-05-10 NOTE — Op Note (Signed)
Procedure Note  Pre-operative Diagnosis:  thyroid neoplasm of uncertain behavior, right thyroid nodule  Post-operative Diagnosis:  same  Surgeon:  Armandina Gemma, MD  Assistant:  none   Procedure:  Right thyroid lobectomy  Anesthesia:  General  Estimated Blood Loss:  minimal  Drains: none         Specimen: thyroid lobe to pathology  Indications:  Patient is referred referred by Dr. 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 Patient had undergone an ultrasound examination ordered by her gynecologist, Dr. Everett Graff. This demonstrated a normal-sized thyroid gland with a solitary 2.8 cm nodule in the right thyroid lobe. Ultrasound-guided fine-needle aspiration biopsy was performed on September 12, 2021. This demonstrated atypia of undetermined significance, Bethesda category III. Specimen was subsequently submitted for molecular genetic testing, AFIRMA, and returned with a result of suspicious. DNA testing revealed a mutation increasing the risk of malignancy to approximately 75%. Patient is therefore referred to surgery for resection for definitive diagnosis and management. Patient has no prior history of thyroid disease. She has never been on thyroid medication. Recent TSH level is normal at 2.12. There is no family history of thyroid disease, and specifically there is no family history of thyroid cancer. Has had no prior head or neck surgery. Patient has been evaluated by her primary care physician, her gynecologist, and her endocrinologist, Dr. Dagmar Hait. Patient presents today for thyroid lobectomy.  Procedure Details: Procedure was done in OR #5 at the [facility]. The patient was brought to the operating room and placed in a supine position on the operating room table. Following administration of general anesthesia, the patient was positioned and then prepped and draped in the  usual aseptic fashion. After ascertaining that an adequate level of anesthesia had been achieved, a small Kocher incision was made with #15 blade. Dissection was carried through subcutaneous tissues and platysma. Hemostasis was achieved with the electrocautery. Skin flaps were elevated cephalad and caudad from the thyroid notch to the sternal notch. Self-retaining retractors were placed for exposure. Strap muscles were incised in the midline and dissection was begun on the right side. Strap muscles were reflected laterally. The right thyroid lobe was mildly enlarged due to a dominant nodule in the mid to inferior portion of the lobe. The lobe was gently mobilized with blunt dissection. Superior pole vessels were dissected out and divided individually between small and medium ligaclips with the harmonic scalpel. The thyroid lobe was rolled anteriorly. Branches of the inferior thyroid artery were divided between small ligaclips with the harmonic scalpel. Inferior venous tributaries were divided between ligaclips. Both the superior and inferior parathyroid glands were identified and preserved on their vascular pedicles. The recurrent laryngeal nerve was identified and preserved along its course. The ligament of Gwenlyn Found was released with the electrocautery and the gland was mobilized onto the anterior trachea. Isthmus was mobilized across the midline. There was no significant pyramidal lobe present. The thyroid parenchyma was transected at the junction of the isthmus and contralateral thyroid lobe with the harmonic scalpel. The thyroid lobe and isthmus were submitted to pathology for review.  The neck was irrigated with warm saline. Fibrillar was placed throughout the operative field. Strap muscles were approximated in the midline with interrupted 3-0 Vicryl sutures. Platysma was closed with interrupted 3-0 Vicryl sutures. Skin was closed with a running 4-0 Monocryl subcuticular suture.  Wound was washed and dried and  Dermabond was applied.  The patient was awakened from anesthesia and brought to the recovery room. The patient tolerated the procedure well.   Armandina Gemma, [facility] Office: [phone]

## 2022-05-10 NOTE — Anesthesia Postprocedure Evaluation (Signed)
Anesthesia Post Note  Patient: ALIENE TAMURA  Procedure(s) Performed: RIGHT THYROID LOBECTOMY (Right: Neck)     Patient location during evaluation: PACU Anesthesia Type: General Level of consciousness: awake and alert Pain management: pain level controlled Vital Signs Assessment: post-procedure vital signs reviewed and stable Respiratory status: spontaneous breathing, nonlabored ventilation, respiratory function stable and patient connected to nasal cannula oxygen Cardiovascular status: blood pressure returned to baseline and stable Postop Assessment: no apparent nausea or vomiting Anesthetic complications: no   No notable events documented.  Last Vitals:  Vitals:   05/10/22 0930 05/10/22 1041  BP: 138/88 134/88  Pulse: (!) 59 66  Resp: 20 18  Temp: 36.5 C 37 C  SpO2: 100% 100%    Last Pain:  Vitals:   05/10/22 1041  TempSrc: Oral  PainSc:                  Landen Knoedler

## 2022-05-11 ENCOUNTER — Encounter (HOSPITAL_COMMUNITY): Payer: Self-pay | Admitting: Surgery

## 2022-05-11 DIAGNOSIS — D440 Neoplasm of uncertain behavior of thyroid gland: Secondary | ICD-10-CM | POA: Diagnosis not present

## 2022-05-11 NOTE — Discharge Summary (Signed)
    Physician Discharge Summary   Patient ID: Rebecca [name] MRN: [id number] DOB/AGE: 03-05-1971 51 y.o.  Admit date: 05/10/2022  Discharge date: 05/11/2022  Discharge Diagnoses:  Principal Problem:   Neoplasm of uncertain behavior of thyroid gland Active Problems:   Right thyroid nodule   Discharged Condition: good  Hospital Course: Patient was admitted for observation following thyroid lobectomy.  Post op course was uncomplicated.  Pain was well controlled.  Tolerated diet.  Patient was prepared for discharge home on POD#1.  Consults: None  Treatments: surgery: thyroid lobectomy  Discharge Exam: Blood pressure 126/80, pulse 69, temperature 98.1 F (36.7 C), resp. rate 18, height '5\' 6"'$  (1.676 m), weight 61.2 kg, SpO2 98 %. HEENT - clear Neck - wound dry and intact; mild STS; voice normal; Dermabond in place  Disposition: Home  Discharge Instructions     Diet - low sodium heart healthy   Complete by: As directed    Increase activity slowly   Complete by: As directed    No dressing needed   Complete by: As directed       Allergies as of 05/11/2022   No Known Allergies      Medication List     TAKE these medications    cabergoline 0.5 MG tablet Commonly known as: DOSTINEX Take 0.5 mg by mouth 2 (two) times a week.   cholecalciferol 25 MCG (1000 UNIT) tablet Commonly known as: VITAMIN D3 Take 1,000 Units by mouth daily.   famotidine 20 MG tablet Commonly known as: PEPCID TAKE 1 TABLET BY MOUTH EVERY DAY What changed: when to take this   fluticasone 50 MCG/ACT nasal spray Commonly known as: FLONASE PLACE 1 SPRAY INTO BOTH NOSTRILS EVERY DAY AS NEEDED.   Fusion Plus Caps TAKE 1 CAPSULE BY MOUTH EVERY DAY What changed:  how much to take when to take this additional instructions   Multivitamin Gummies [employer] 2 each by mouth daily.               Discharge Care Instructions  (From admission, onward)           Start      Ordered   05/11/22 0000  No dressing needed        05/11/22 0388            Follow-up Information     Armandina Gemma, MD. Schedule an appointment as soon as possible for a visit in 3 week(s).   Specialty: General Surgery Why: For wound re-check Contact information: [place] [phone]                 Rayen Palen, [facility] Office: [phone]   Signed: Armandina Gemma 05/11/2022, 8:59 AM

## 2022-05-11 NOTE — Discharge Instructions (Signed)
[facility] - Dr. Glenna Brunkow  THYROID & PARATHYROID SURGERY:  POST-OP INSTRUCTIONS  Always review the instruction sheet provided by the hospital nurse at discharge.  A prescription for pain medication may be sent to your pharmacy at the time of discharge.  Take your pain medication as prescribed.  If narcotic pain medicine is not needed, then you may take acetaminophen (Tylenol) or ibuprofen (Advil) as needed for pain or soreness.  Take your normal home medications as prescribed unless otherwise directed.  If you need a refill on your pain medication, please contact the office during regular business hours.  Prescriptions will not be processed by the office after 5:00PM or on weekends.  Start with a light diet upon arrival home, such as soup and crackers or toast.  Be sure to drink plenty of fluids.  Resume your normal diet the day after surgery.  Most patients will experience some swelling and bruising on the chest and neck area.  Ice packs will help for the first 48 hours after arriving home.  Swelling and bruising will take several days to resolve.   It is common to experience some constipation after surgery.  Increasing fluid intake and taking a stool softener (Colace) will usually help to prevent this problem.  A mild laxative (Milk of Magnesia or Miralax) should be taken according to package directions if there has been no bowel movement after 48 hours.  Dermabond glue covers your incision. This seals the wound and you may shower at any time. The Dermabond will remain in place for about a week.  You may gradually remove the glue when it loosens around the edges.  If you need to loosen the Dermabond for removal, apply a layer of Vaseline to the wound for 15 minutes and then remove with a Kleenex. Your sutures are under the skin and will not show - they will dissolve on their own.  You may resume light daily activities beginning the day after discharge (such as self-care,  walking, climbing stairs), gradually increasing activities as tolerated. You may have sexual intercourse when it is comfortable. Refrain from any heavy lifting or straining until approved by your doctor. You may drive when you no longer are taking prescription pain medication, you can comfortably wear a seatbelt, and you can safely maneuver your car and apply the brakes.  You will see your doctor in the office for a follow-up appointment approximately three weeks after your surgery.  Make sure that you call for this appointment within a day or two after you arrive home to insure a convenient appointment time. Please have any requested laboratory tests performed a few days prior to your office visit so that the results will be available at your follow up appointment.  WHEN TO CALL THE [facility] OFFICE: -- Fever greater than 101.5 -- Inability to urinate -- Nausea and/or vomiting - persistent -- Extreme swelling or bruising -- Continued bleeding from incision -- Increased pain, redness, or drainage from the incision -- Difficulty swallowing or breathing -- Muscle cramping or spasms -- Numbness or tingling in hands or around lips  The clinic staff is available to answer your questions during regular business hours.  Please don't hesitate to call and ask to speak to one of the nurses if you have concerns.  [facility] OFFICE: [phone] (24 hours)  Please sign up for MyChart accounts. This will allow you to communicate directly with my nurse or myself without having to call the office. It will also allow you   to view your test results. You will need to enroll in MyChart for my office ([facility]) and for the hospital ([facility]).  Montrice Montuori, MD [facility] 

## 2022-05-11 NOTE — Progress Notes (Signed)
Reviewed written d/c instructions w pt and all questions answered. She verbalized understanding. D/C via w/c w all belongings in stable condition. 

## 2022-05-14 ENCOUNTER — Other Ambulatory Visit: Payer: Self-pay | Admitting: Internal Medicine

## 2022-05-17 LAB — SURGICAL PATHOLOGY

## 2022-05-17 NOTE — Progress Notes (Signed)
Good news!  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  It appears it has been completely removed and nothing else needs to be done at this time.  I will have a copy of the path report for the patient at her post op visit.  tmg  Armandina Gemma, [facility] Office: [phone]

## 2022-06-28 ENCOUNTER — Other Ambulatory Visit: Payer: Self-pay | Admitting: Internal Medicine

## 2022-06-28 DIAGNOSIS — E042 Nontoxic multinodular goiter: Secondary | ICD-10-CM

## 2022-07-12 ENCOUNTER — Ambulatory Visit
Admission: RE | Admit: 2022-07-12 | Discharge: 2022-07-12 | Disposition: A | Discharge status: Home / Self Care | Payer: BC Managed Care – PPO | Source: Ambulatory Visit | Attending: Internal Medicine | Admitting: Internal Medicine

## 2022-07-12 DIAGNOSIS — E042 Nontoxic multinodular goiter: Secondary | ICD-10-CM

## 2022-08-16 ENCOUNTER — Encounter: Payer: Self-pay | Admitting: Internal Medicine

## 2022-08-16 ENCOUNTER — Telehealth: Payer: BC Managed Care – PPO | Admitting: Internal Medicine

## 2022-08-16 VITALS — Temp 98.1°F

## 2022-08-16 DIAGNOSIS — U071 COVID-19: Secondary | ICD-10-CM | POA: Diagnosis not present

## 2022-08-16 NOTE — Progress Notes (Signed)
Virtual Visit via Video   This visit type was conducted due to national recommendations for restrictions regarding the COVID-19 Pandemic (e.g. social distancing) in an effort to limit this patient's exposure and mitigate transmission in our community.  Due to her co-morbid illnesses, this patient is at least at moderate risk for complications without adequate follow up.  This format is felt to be most appropriate for this patient at this time.  All issues noted in this document were discussed and addressed.  A limited physical exam was performed with this format.    This visit type was conducted due to national recommendations for restrictions regarding the COVID-19 Pandemic (e.g. social distancing) in an effort to limit this patient's exposure and mitigate transmission in our community.  Patients identity confirmed using two different identifiers.  This format is felt to be most appropriate for this patient at this time.  All issues noted in this document were discussed and addressed.  No physical exam was performed (except for noted visual exam findings with Video Visits).    Date:  08/16/2022   ID:  Rebecca [name], DOB 1970/10/22, MRN [id number]  Patient Location:  Home  Provider location:   Office    Chief Complaint:  "I have COVID"  History of Present Illness:    Rebecca [name] is a 52 y.o. female who presents via video conferencing for a telehealth visit today.    The patient does have symptoms concerning for COVID-19 infection (fever, chills, cough, or new shortness of breath).   She presents today for virtual visit. She prefers this method of contact due to COVID-19 pandemic.  Pt presents today after testing positive for Covid this morning. She did a home test. She reports initially feeling bad Tuesday night. She had a sore throat when she went to bed Tuesday night. On Wednesday, she had worsening fatigue. Today, she awakened with head congestion.  She experiences sore throat,  lack of energy, head congestion.  She is using Theraflu at home w/ some relief of her sx.       Past Medical History:  Diagnosis Date   Iron deficiency anemia    Leiomyoma    Past Surgical History:  Procedure Laterality Date   DILATATION & CURETTAGE/HYSTEROSCOPY WITH MYOSURE N/A 08/28/2021   Procedure: DILATATION & CURETTAGE/HYSTEROSCOPY WITH MYOSURE;  Surgeon: Everett Graff, MD;  Location: [facility];  Service: Gynecology;  Laterality: N/A;   HYSTEROSCOPY WITH NOVASURE N/A 08/28/2021   Procedure: ENDOMETRIAL ABLATION WITH NOVASURE;  Surgeon: Everett Graff, MD;  Location: [facility];  Service: Gynecology;  Laterality: N/A;   MYOMECTOMY  2002   THYROID LOBECTOMY Right 05/10/2022   Procedure: RIGHT THYROID LOBECTOMY;  Surgeon: Armandina Gemma, MD;  Location: [facility];  Service: General;  Laterality: Right;   WISDOM TOOTH EXTRACTION       Current Meds  Medication Sig   cabergoline (DOSTINEX) 0.5 MG tablet Take 0.5 mg by mouth 2 (two) times a week.   cholecalciferol (VITAMIN D3) 25 MCG (1000 UNIT) tablet Take 1,000 Units by mouth daily.   famotidine (PEPCID) 20 MG tablet Take 1 tablet (20 mg total) by mouth at bedtime.   fluticasone (FLONASE) 50 MCG/ACT nasal spray PLACE 1 SPRAY INTO BOTH NOSTRILS EVERY DAY AS NEEDED.   Iron-FA-B Cmp-C-Biot-Probiotic (FUSION PLUS) CAPS TAKE 1 CAPSULE BY MOUTH EVERY DAY (Patient taking differently: Take 1 capsule by mouth See admin instructions. Takes ONLY during cycle)   Multiple Vitamins-Minerals (MULTIVITAMIN GUMMIES WOMENS) CHEW Chew 2 each by  mouth daily.     Allergies:   Patient has no known allergies.   Social History   Tobacco Use   Smoking status: Never   Smokeless tobacco: Never  Vaping Use   Vaping Use: Never used  Substance Use Topics   Alcohol use: Never   Drug use: Never     Family Hx: The patient's family history includes Lymphoma in her mother; Prostate cancer in her father. There is no history of Breast cancer.  ROS:   Please see  the history of present illness.    Review of Systems  Constitutional:  Positive for malaise/fatigue.  HENT:  Positive for sore throat.   Eyes: Negative.   Respiratory: Negative.    Cardiovascular: Negative.   Genitourinary: Negative.   Skin: Negative.   Endo/Heme/Allergies: Negative.   Psychiatric/Behavioral: Negative.      All other systems reviewed and are negative.   Labs/Other Tests and Data Reviewed:    Recent Labs: 02/27/2022: TSH 2.120 04/13/2022: ALT 9; BUN 12; Creatinine, Ser 1.03; Potassium 4.4; Sodium 142 04/27/2022: Hemoglobin 12.3; Platelets 328   Recent Lipid Panel Lab Results  Component Value Date/Time   CHOL 156 04/13/2022 04:10 PM   TRIG 86 04/13/2022 04:10 PM   HDL 58 04/13/2022 04:10 PM   CHOLHDL 2.7 04/13/2022 04:10 PM   LDLCALC 82 04/13/2022 04:10 PM    Wt Readings from Last 3 Encounters:  05/10/22 134 lb 14.7 oz (61.2 kg)  04/27/22 135 lb (61.2 kg)  04/13/22 136 lb 12.8 oz (62.1 kg)     Exam:    Vital Signs:  Temp 98.1 F (36.7 C)     Physical Exam Vitals and nursing note reviewed.  Constitutional:      Appearance: Normal appearance.  HENT:     Head: Normocephalic and atraumatic.  Eyes:     Extraocular Movements: Extraocular movements intact.  Pulmonary:     Effort: Pulmonary effort is normal.  Musculoskeletal:     Cervical back: Normal range of motion.  Neurological:     Mental Status: She is alert and oriented to person, place, and time.  Psychiatric:        Mood and Affect: Affect normal.     ASSESSMENT & PLAN:    1. COVID - Temperature monitoring; Future Advised patient to take Vitamin C, D, Zinc.  Keep yourself hydrated with a lot of water and rest. Take Delsym for cough and Mucinex as needed. Take Tylenol or pain reliever every 4-6 hours as needed for pain/fever/body ache. If you have elevated blood pressure, you can take OTC Coricidin as needed. You can also take OTC oscillococcinum, a homeopathic remedy, to help with your  symptoms.  Melatonin taken nightly may also be helpful.  Educated patient if symptoms get worse or if she experiences any SOB, chest pain or pain in her legs to seek immediate emergency care. Continue to monitor your oxygen levels. Call us if you have any questions. Quarantine for 5 days and wear mask for another five days around others.       COVID-19 Education: The signs and symptoms of COVID-19 were discussed with the patient and how to seek care for testing (follow up with PCP or arrange E-visit).  The importance of social distancing was discussed today.  Patient Risk:   After full review of this patients clinical status, I feel that they are at least moderate risk at this time.  Time:   Today, I have spent 12 minutes/ seconds with the patient  with telehealth technology discussing above diagnoses.     Medication Adjustments/Labs and Tests Ordered: Current medicines are reviewed at length with the patient today.  Concerns regarding medicines are outlined above.   Tests Ordered: No orders of the defined types were placed in this encounter.   Medication Changes: No orders of the defined types were placed in this encounter.   Disposition:  Follow up prn  Signed, Maximino Greenland, MD

## 2022-08-16 NOTE — Patient Instructions (Addendum)
Advised patient to take Vitamin C, D, Zinc.  Keep yourself hydrated with a lot of water and rest. Take Delsym for cough/congestion as needed. Take Tylenol or pain reliever every 4-6 hours as needed for pain/fever/body ache. You can also take OTC oscillococcinum, a homeopathic remedy, to help with your symptoms.  Educated patient if symptoms get worse or if she experiences any SOB, chest pain or pain in her legs to seek immediate emergency care. Continue to monitor your oxygen levels. Call us if you have any questions. Quarantine for 5 days and wear a mask for 10 days.   COVID-19 COVID-19, or coronavirus disease 2019, is an infection that is caused by a new (novel) coronavirus called SARS-CoV-2. COVID-19 can cause many symptoms. In some people, the virus may not cause any symptoms. In others, it may cause mild or severe symptoms. Some people with severe infection develop severe disease. What are the causes? This illness is caused by a virus. The virus may be in the air as tiny specks of fluid (aerosols) or droplets, or it may be on surfaces. You may catch the virus by: Breathing in droplets from an infected person. Droplets can be spread by a person breathing, speaking, singing, coughing, or sneezing. Touching something, like a table or a doorknob, that has virus on it (is contaminated) and then touching your mouth, nose, or eyes. What increases the risk? Risk for infection: You are more likely to get infected with the COVID-19 virus if: You are within 6 ft (1.8 m) of a person with COVID-19 for 15 minutes or longer. You are providing care for a person who is infected with COVID-19. You are in close personal contact with other people. Close personal contact includes hugging, kissing, or sharing eating or drinking utensils. Risk for serious illness caused by COVID-19: You are more likely to get seriously ill from the COVID-19 virus if: You have cancer. You have a long-term (chronic) disease, such  as: Chronic lung disease. This includes pulmonary embolism, chronic obstructive pulmonary disease, and cystic fibrosis. Long-term disease that lowers your body's ability to fight infection (immunocompromise). Serious cardiac conditions, such as heart failure, coronary artery disease, or cardiomyopathy. Diabetes. Chronic kidney disease. Liver diseases. These include cirrhosis, nonalcoholic fatty liver disease, alcoholic liver disease, or autoimmune hepatitis. You have obesity. You are pregnant or were recently pregnant. You have sickle cell disease. What are the signs or symptoms? Symptoms of this condition can range from mild to severe. Symptoms may appear any time from 2 to 14 days after being exposed to the virus. They include: Fever or chills. Shortness of breath or trouble breathing. Feeling tired or very tired. Headaches, body aches, or muscle aches. Runny or stuffy nose, sneezing, coughing, or sore throat. New loss of taste or smell. This is rare. Some people may also have stomach problems, such as nausea, vomiting, or diarrhea. Other people may not have any symptoms of COVID-19. How is this diagnosed? This condition may be diagnosed by testing samples to check for the COVID-19 virus. The most common tests are the PCR test and the antigen test. Tests may be done in the lab or at home. They include: Using a swab to take a sample of fluid from the back of your nose and throat (nasopharyngeal fluid), from your nose, or from your throat. Testing a sample of saliva from your mouth. Testing a sample of coughed-up mucus from your lungs (sputum). How is this treated? Treatment for COVID-19 infection depends on the severity of  the condition. Mild symptoms can be managed at home with rest, fluids, and over-the-counter medicines. Serious symptoms may be treated in a hospital intensive care unit (ICU). Treatment in the ICU may include: Supplemental oxygen. Extra oxygen is given through a tube  in the nose, a face mask, or a hood. Medicines. These may include: Antivirals, such as monoclonal antibodies. These help your body fight off certain viruses that can cause disease. Anti-inflammatories, such as corticosteroids. These reduce inflammation and suppress the immune system. Antithrombotics. These prevent or treat blood clots, if they develop. Convalescent plasma. This helps boost your immune system, if you have an underlying immunosuppressive condition or are getting immunosuppressive treatments. Prone positioning. This means you will lie on your stomach. This helps oxygen to get into your lungs. Infection control measures. If you are at risk for more serious illness caused by COVID-19, your health care provider may prescribe two long-acting monoclonal antibodies, given together every 6 months. How is this prevented? To protect yourself: Use preventive medicine (pre-exposure prophylaxis). You may get pre-exposure prophylaxis if you have moderate or severe immunocompromise. Get vaccinated. Anyone 51 months old or older who meets guidelines can get a COVID-19 vaccine or vaccine series. This includes people who are pregnant or making breast milk (lactating). Get an added dose of COVID-19 vaccine after your first vaccine or vaccine series if you have moderate to severe immunocompromise. This applies if you have had a solid organ transplant or have been diagnosed with an immunocompromising condition. You should get the added dose 4 weeks after you got the first COVID-19 vaccine or vaccine series. If you get an mRNA vaccine, you will need a 3-dose primary series. If you get the J&J/Janssen vaccine, you will need a 2-dose primary series, with the second dose being an mRNA vaccine. Talk to your health care provider about getting experimental monoclonal antibodies. This treatment is approved under emergency use authorization to prevent severe illness before or after being exposed to the COVID-19  virus. You may be given monoclonal antibodies if: You have moderate or severe immunocompromise. This includes treatments that lower your immune response. People with immunocompromise may not develop protection against COVID-19 when they are vaccinated. You cannot be vaccinated. You may not get a vaccine if you have a severe allergic reaction to the vaccine or its components. You are not fully vaccinated. You are in a facility where COVID-19 is present and: Are in close contact with a person who is infected with the COVID-19 virus. Are at high risk of being exposed to the COVID-19 virus. You are at risk of illness from new variants of the COVID-19 virus. To protect others: If you have symptoms of COVID-19, take steps to prevent the virus from spreading to others. Stay home. Leave your house only to get medical care. Do not use public transit, if possible. Do not travel while you are sick. Wash your hands often with soap and water for at least 20 seconds. If soap and water are not available, use alcohol-based hand sanitizer. Make sure that all people in your household wash their hands well and often. Cough or sneeze into a tissue or your sleeve or elbow. Do not cough or sneeze into your hand or into the air. Where to find more information Centers for Disease Control and Prevention: [employer] World Health Organization: https://www.castaneda.info/ Get help right away if: You have trouble breathing. You have pain or pressure in your chest. You are confused. You have bluish lips and fingernails. You have trouble  waking from sleep. You have symptoms that get worse. These symptoms may be an emergency. Get help right away. Call 911. Do not wait to see if the symptoms will go away. Do not drive yourself to the hospital. Summary COVID-19 is an infection that is caused by a new coronavirus. Sometimes, there are no symptoms. Other times, symptoms range from mild to severe.  Some people with a severe COVID-19 infection develop severe disease. The virus that causes COVID-19 can spread from person to person through droplets or aerosols from breathing, speaking, singing, coughing, or sneezing. Mild symptoms of COVID-19 can be managed at home with rest, fluids, and over-the-counter medicines. This information is not intended to replace advice given to you by your health care provider. Make sure you discuss any questions you have with your health care provider. Document Revised: 07/04/2021 Document Reviewed: 07/06/2021 Elsevier Patient Education  [place].

## 2022-08-17 ENCOUNTER — Encounter: Payer: Self-pay | Admitting: Internal Medicine

## 2022-08-18 ENCOUNTER — Encounter: Payer: Self-pay | Admitting: Internal Medicine

## 2022-10-18 ENCOUNTER — Ambulatory Visit: Payer: BC Managed Care – PPO | Admitting: Internal Medicine

## 2022-10-18 ENCOUNTER — Encounter: Payer: Self-pay | Admitting: Internal Medicine

## 2022-10-18 VITALS — BP 120/70 | HR 85 | Temp 97.8°F | Ht 66.0 in | Wt 139.6 lb

## 2022-10-18 DIAGNOSIS — E221 Hyperprolactinemia: Secondary | ICD-10-CM

## 2022-10-18 DIAGNOSIS — Z23 Encounter for immunization: Secondary | ICD-10-CM

## 2022-10-18 DIAGNOSIS — Z6822 Body mass index (BMI) 22.0-22.9, adult: Secondary | ICD-10-CM | POA: Diagnosis not present

## 2022-10-18 DIAGNOSIS — D352 Benign neoplasm of pituitary gland: Secondary | ICD-10-CM

## 2022-10-18 DIAGNOSIS — D500 Iron deficiency anemia secondary to blood loss (chronic): Secondary | ICD-10-CM | POA: Diagnosis not present

## 2022-10-18 NOTE — Progress Notes (Unsigned)
Barnet Glasgow Martin,acting as a Education administrator for Maximino Greenland, MD.,have documented all relevant documentation on the behalf of Maximino Greenland, MD,as directed by  Maximino Greenland, MD while in the presence of Maximino Greenland, MD.    Subjective:     Patient ID: Rebecca [name] , female    DOB: [birth year] , 52 y.o.   MRN: [id number]   Chief Complaint  Patient presents with   Anemia    HPI  Patient presents today for anemia follow up. Patient reports compliance with medications and has no other concerns. She is not having any sob, pica or headaches, at this time.   BP Readings from Last 3 Encounters: 10/18/22 : 120/70 05/11/22 : 126/80 04/27/22 : (!) 115/90    Anemia Presents for follow-up visit. There has been no abdominal pain, pallor, palpitations or paresthesias. Signs of blood loss that are present include menorrhagia. There are no compliance problems.      Past Medical History:  Diagnosis Date   Iron deficiency anemia    Leiomyoma      Family History  Problem Relation Age of Onset   Lymphoma Mother    Prostate cancer Father    Breast cancer Neg Hx      Current Outpatient Medications:    cabergoline (DOSTINEX) 0.5 MG tablet, Take 0.5 mg by mouth 2 (two) times a week., Disp: , Rfl:    cholecalciferol (VITAMIN D3) 25 MCG (1000 UNIT) tablet, Take 1,000 Units by mouth daily., Disp: , Rfl:    famotidine (PEPCID) 20 MG tablet, Take 1 tablet (20 mg total) by mouth at bedtime., Disp: 90 tablet, Rfl: 1   fluticasone (FLONASE) 50 MCG/ACT nasal spray, PLACE 1 SPRAY INTO BOTH NOSTRILS EVERY DAY AS NEEDED., Disp: 16 mL, Rfl: 1   Iron-FA-B Cmp-C-Biot-Probiotic (FUSION PLUS) CAPS, TAKE 1 CAPSULE BY MOUTH EVERY DAY (Patient taking differently: Take 1 capsule by mouth See admin instructions. Takes ONLY during cycle), Disp: 30 capsule, Rfl: 1   Multiple Vitamins-Minerals (MULTIVITAMIN GUMMIES WOMENS) CHEW, Chew 2 each by mouth daily., Disp: , Rfl:    No Known Allergies   Review of  Systems  Constitutional: Negative.   Respiratory: Negative.    Cardiovascular: Negative.  Negative for palpitations.  Gastrointestinal: Negative.  Negative for abdominal pain.  Genitourinary:  Positive for menorrhagia.  Skin: Negative.  Negative for pallor.  Neurological: Negative.  Negative for paresthesias.  Psychiatric/Behavioral: Negative.       Today's Vitals   10/18/22 1502  BP: 120/70  Pulse: 85  Temp: 97.8 F (36.6 C)  TempSrc: Oral  Weight: 139 lb 9.6 oz (63.3 kg)  Height: 5\' 6"  (1.676 m)  PainSc: 0-No pain   Body mass index is 22.53 kg/m.  Wt Readings from Last 3 Encounters:  10/18/22 139 lb 9.6 oz (63.3 kg)  05/10/22 134 lb 14.7 oz (61.2 kg)  04/27/22 135 lb (61.2 kg)    Objective:  Physical Exam Vitals and nursing note reviewed.  Constitutional:      Appearance: Normal appearance.  HENT:     Head: Normocephalic and atraumatic.     Nose:     Comments: Masked     Mouth/Throat:     Comments: Masked  Eyes:     Extraocular Movements: Extraocular movements intact.  Cardiovascular:     Rate and Rhythm: Normal rate and regular rhythm.     Heart sounds: Normal heart sounds.  Pulmonary:     Effort: Pulmonary effort is normal.  Breath sounds: Normal breath sounds.  Musculoskeletal:     Cervical back: Normal range of motion.  Skin:    General: Skin is warm.  Neurological:     General: No focal deficit present.     Mental Status: She is alert.  Psychiatric:        Mood and Affect: Mood normal.        Behavior: Behavior normal.      Assessment And Plan:     1. Iron deficiency anemia due to chronic blood loss Comments: I will check CBC/iron levels today. I will supplement as needed. She is currently taking iron during her menses. - Iron, TIBC and Ferritin Panel - CBC no Diff  2. Hyperprolactinemia ([place]) Comments: She has her prolactin levels followed by Dr. Buddy Duty, Endocrinology. I will request his records.  3. Pituitary adenoma ([place]) Comments:  Please see above.  4. BMI 22.0-22.9, adult Comments: She is encouraged to aim for at least 150 minutes of exercise/week.  5. Need for zoster vaccination - Zoster Recombinant (Shingrix )   Patient was given opportunity to ask questions. Patient verbalized understanding of the plan and was able to repeat key elements of the plan. All questions were answered to their satisfaction.   I, Maximino Greenland, MD, have reviewed all documentation for this visit. The documentation on 10/18/22 for the exam, diagnosis, procedures, and orders are all accurate and complete.   IF YOU HAVE BEEN REFERRED TO A SPECIALIST, IT MAY TAKE 1-2 WEEKS TO SCHEDULE/PROCESS THE REFERRAL. IF YOU HAVE NOT HEARD FROM US/SPECIALIST IN TWO WEEKS, PLEASE GIVE [place] A CALL AT [phone] X [phone].   THE PATIENT IS ENCOURAGED TO PRACTICE SOCIAL DISTANCING DUE TO THE COVID-19 PANDEMIC.

## 2022-10-18 NOTE — Patient Instructions (Signed)

## 2022-10-19 LAB — IRON,TIBC AND FERRITIN PANEL
Ferritin: 37 ng/mL (ref 15–150)
Iron Saturation: 21 % (ref 15–55)
Iron: 66 ug/dL (ref 27–159)
Total Iron Binding Capacity: 313 ug/dL (ref 250–450)
UIBC: 247 ug/dL (ref 131–425)

## 2022-10-19 LAB — CBC
Hematocrit: 35.1 % (ref 34.0–46.6)
Hemoglobin: 12.2 g/dL (ref 11.1–15.9)
MCH: 29.3 pg (ref 26.6–33.0)
MCHC: 34.8 g/dL (ref 31.5–35.7)
MCV: 84 fL (ref 79–97)
Platelets: 302 10*3/uL (ref 150–450)
RBC: 4.17 x10E6/uL (ref 3.77–5.28)
RDW: 13.1 % (ref 11.7–15.4)
WBC: 7.8 10*3/uL (ref 3.4–10.8)

## 2022-11-09 ENCOUNTER — Other Ambulatory Visit: Payer: Self-pay | Admitting: Internal Medicine

## 2023-02-13 ENCOUNTER — Encounter: Payer: Self-pay | Admitting: Internal Medicine

## 2023-02-13 ENCOUNTER — Ambulatory Visit: Payer: BC Managed Care – PPO | Admitting: Internal Medicine

## 2023-02-13 VITALS — BP 110/80 | HR 84 | Temp 98.3°F | Ht 66.0 in | Wt 137.6 lb

## 2023-02-13 DIAGNOSIS — M79644 Pain in right finger(s): Secondary | ICD-10-CM

## 2023-02-13 DIAGNOSIS — R200 Anesthesia of skin: Secondary | ICD-10-CM | POA: Diagnosis not present

## 2023-02-13 DIAGNOSIS — R202 Paresthesia of skin: Secondary | ICD-10-CM

## 2023-02-13 NOTE — Progress Notes (Signed)
I,Victoria T Deloria Lair, CMA,acting as a Neurosurgeon for Gwynneth Aliment, MD.,have documented all relevant documentation on the behalf of Gwynneth Aliment, MD,as directed by  Gwynneth Aliment, MD while in the presence of Gwynneth Aliment, MD.  Subjective:  Patient ID: Rebecca [name] , female    DOB: 1970-08-02 , 52 y.o.   MRN: [id number]  Chief Complaint  Patient presents with   Numbness   Tingling    HPI  Patient presents today for tingling and numbness in left forearm and hand. Her sx started about 2-3 weeks ago. She is not sure what may have precipitated her symptoms.  She states her thumb and middle 3 fingers are often numb. Sometimes it feels as if her entire hand is numb.    Additionally, she c/o right thumb pain. She states the pain is exacerbated by lifting an object or turning a knob. Otherwise, she has no symptoms.  These sx started a month ago. She admits she does a lot of typing at work. These sx do not awaken her from sleep. She also denies a loss in her grip strength.   . Denies headache, chest pain, SOB, blurred vision, and dizziness.      Past Medical History:  Diagnosis Date   Iron deficiency anemia    Leiomyoma      Family History  Problem Relation Age of Onset   Lymphoma Mother    Prostate cancer Father    Breast cancer Neg Hx      Current Outpatient Medications:    cabergoline (DOSTINEX) 0.5 MG tablet, Take 0.5 mg by mouth 2 (two) times a week., Disp: , Rfl:    cholecalciferol (VITAMIN D3) 25 MCG (1000 UNIT) tablet, Take 1,000 Units by mouth daily., Disp: , Rfl:    famotidine (PEPCID) 20 MG tablet, TAKE 1 TABLET BY MOUTH EVERYDAY AT BEDTIME, Disp: 90 tablet, Rfl: 1   fluticasone (FLONASE) 50 MCG/ACT nasal spray, PLACE 1 SPRAY INTO BOTH NOSTRILS EVERY DAY AS NEEDED., Disp: 16 mL, Rfl: 1   Iron-FA-B Cmp-C-Biot-Probiotic (FUSION PLUS) CAPS, TAKE 1 CAPSULE BY MOUTH EVERY DAY (Patient taking differently: Take 1 capsule by mouth See admin instructions. Takes ONLY during  cycle), Disp: 30 capsule, Rfl: 1   Multiple Vitamins-Minerals (MULTIVITAMIN GUMMIES WOMENS) CHEW, Chew 2 each by mouth daily., Disp: , Rfl:    No Known Allergies   Review of Systems  Constitutional: Negative.   Respiratory: Negative.    Cardiovascular: Negative.   Neurological:  Positive for numbness.  Psychiatric/Behavioral: Negative.       Today's Vitals   02/13/23 1410  BP: 110/80  Pulse: 84  Temp: 98.3 F (36.8 C)  SpO2: 98%  Weight: 137 lb 9.6 oz (62.4 kg)  Height: 5\' 6"  (1.676 m)   Body mass index is 22.21 kg/m.  Wt Readings from Last 3 Encounters:  02/13/23 137 lb 9.6 oz (62.4 kg)  10/18/22 139 lb 9.6 oz (63.3 kg)  05/10/22 134 lb 14.7 oz (61.2 kg)     Objective:  Physical Exam Vitals and nursing note reviewed.  Constitutional:      Appearance: Normal appearance.  HENT:     Head: Normocephalic and atraumatic.  Eyes:     Extraocular Movements: Extraocular movements intact.  Cardiovascular:     Rate and Rhythm: Normal rate and regular rhythm.     Heart sounds: Normal heart sounds.  Pulmonary:     Effort: Pulmonary effort is normal.     Breath sounds: Normal breath sounds.  Musculoskeletal:     Cervical back: Normal range of motion.     Comments: Neg Tinel's Pos Phalens on LUE  Skin:    General: Skin is warm.  Neurological:     General: No focal deficit present.     Mental Status: She is alert.  Psychiatric:        Mood and Affect: Mood normal.        Behavior: Behavior normal.         Assessment And Plan:  Numbness and tingling in left arm Assessment & Plan: I will check labs as below. She also agrees to NCS to r/o CTS. She agrees to see hand specialist if results are abnormal. Advised to wear wrist splint, will send rx.   Orders: -     TSH -     Vitamin B12 -     Ambulatory referral to Neurology  Pain of right thumb Assessment & Plan: Advised to apply Voltaren gel to affected area twice daily prn. Will consider x-ray if sx persist.        Return if symptoms worsen or fail to improve, for med records request for Dr. Sharl Ma last 2 years notes/labs.  Patient was given opportunity to ask questions. Patient verbalized understanding of the plan and was able to repeat key elements of the plan. All questions were answered to their satisfaction.   I, Gwynneth Aliment, MD, have reviewed all documentation for this visit. The documentation on 02/13/23 for the exam, diagnosis, procedures, and orders are all accurate and complete.   IF YOU HAVE BEEN REFERRED TO A SPECIALIST, IT MAY TAKE 1-2 WEEKS TO SCHEDULE/PROCESS THE REFERRAL. IF YOU HAVE NOT HEARD FROM US/SPECIALIST IN TWO WEEKS, PLEASE GIVE [place] A CALL AT [phone].   THE PATIENT IS ENCOURAGED TO PRACTICE SOCIAL DISTANCING DUE TO THE COVID-19 PANDEMIC.

## 2023-02-13 NOTE — Patient Instructions (Signed)

## 2023-02-14 LAB — VITAMIN B12: Vitamin B-12: 932 pg/mL (ref 232–1245)

## 2023-02-14 LAB — TSH: TSH: 2.46 u[IU]/mL (ref 0.450–4.500)

## 2023-02-15 ENCOUNTER — Encounter: Payer: Self-pay | Admitting: Internal Medicine

## 2023-02-17 DIAGNOSIS — R200 Anesthesia of skin: Secondary | ICD-10-CM | POA: Insufficient documentation

## 2023-02-17 NOTE — Assessment & Plan Note (Signed)
Advised to apply Voltaren gel to affected area twice daily prn. Will consider x-ray if sx persist.

## 2023-02-17 NOTE — Assessment & Plan Note (Signed)
I will check labs as below. She also agrees to NCS to r/o CTS. She agrees to see hand specialist if results are abnormal. Advised to wear wrist splint, will send rx.

## 2023-04-05 ENCOUNTER — Other Ambulatory Visit: Payer: Self-pay | Admitting: Internal Medicine

## 2023-04-05 DIAGNOSIS — Z1231 Encounter for screening mammogram for malignant neoplasm of breast: Secondary | ICD-10-CM

## 2023-04-10 ENCOUNTER — Ambulatory Visit
Admission: RE | Admit: 2023-04-10 | Discharge: 2023-04-10 | Disposition: A | Discharge status: Home / Self Care | Payer: BC Managed Care – PPO | Source: Ambulatory Visit | Attending: Internal Medicine | Admitting: Internal Medicine

## 2023-04-10 DIAGNOSIS — Z1231 Encounter for screening mammogram for malignant neoplasm of breast: Secondary | ICD-10-CM

## 2023-04-15 ENCOUNTER — Other Ambulatory Visit: Payer: Self-pay | Admitting: Internal Medicine

## 2023-04-15 DIAGNOSIS — R928 Other abnormal and inconclusive findings on diagnostic imaging of breast: Secondary | ICD-10-CM

## 2023-04-16 NOTE — Patient Instructions (Incomplete)
Rebecca [name] - singer  Health Maintenance, Female Adopting a healthy lifestyle and getting preventive care are important in promoting health and wellness. Ask your health care provider about: The right schedule for you to have regular tests and exams. Things you can do on your own to prevent diseases and keep yourself healthy. What should I know about diet, weight, and exercise? Eat a healthy diet  Eat a diet that includes plenty of vegetables, fruits, low-fat dairy products, and lean protein. Do not eat a lot of foods that are high in solid fats, added sugars, or sodium. Maintain a healthy weight Body mass index (BMI) is used to identify weight problems. It estimates body fat based on height and weight. Your health care provider can help determine your BMI and help you achieve or maintain a healthy weight. Get regular exercise Get regular exercise. This is one of the most important things you can do for your health. Most adults should: Exercise for at least 150 minutes each week. The exercise should increase your heart rate and make you sweat (moderate-intensity exercise). Do strengthening exercises at least twice a week. This is in addition to the moderate-intensity exercise. Spend less time sitting. Even light physical activity can be beneficial. Watch cholesterol and blood lipids Have your blood tested for lipids and cholesterol at 52 years of age, then have this test every 5 years. Have your cholesterol levels checked more often if: Your lipid or cholesterol levels are high. You are older than 52 years of age. You are at high risk for heart disease. What should I know about cancer screening? Depending on your health history and family history, you may need to have cancer screening at various ages. This may include screening for: Breast cancer. Cervical cancer. Colorectal cancer. Skin cancer. Lung cancer. What should I know about heart disease, diabetes, and high blood  pressure? Blood pressure and heart disease High blood pressure causes heart disease and increases the risk of stroke. This is more likely to develop in people who have high blood pressure readings or are overweight. Have your blood pressure checked: Every 3-5 years if you are 57-62 years of age. Every year if you are 46 years old or older. Diabetes Have regular diabetes screenings. This checks your fasting blood sugar level. Have the screening done: Once every three years after age 58 if you are at a normal weight and have a low risk for diabetes. More often and at a younger age if you are overweight or have a high risk for diabetes. What should I know about preventing infection? Hepatitis B If you have a higher risk for hepatitis B, you should be screened for this virus. Talk with your health care provider to find out if you are at risk for hepatitis B infection. Hepatitis C Testing is recommended for: Everyone born from [birth year] through 1965. Anyone with known risk factors for hepatitis C. Sexually transmitted infections (STIs) Get screened for STIs, including gonorrhea and chlamydia, if: You are sexually active and are younger than 52 years of age. You are older than 52 years of age and your health care provider tells you that you are at risk for this type of infection. Your sexual activity has changed since you were last screened, and you are at increased risk for chlamydia or gonorrhea. Ask your health care provider if you are at risk. Ask your health care provider about whether you are at high risk for HIV. Your health care provider may recommend a prescription  medicine to help prevent HIV infection. If you choose to take medicine to prevent HIV, you should first get tested for HIV. You should then be tested every 3 months for as long as you are taking the medicine. Pregnancy If you are about to stop having your period (premenopausal) and you may become pregnant, seek counseling before you  get pregnant. Take 400 to 800 micrograms (mcg) of folic acid every day if you become pregnant. Ask for birth control (contraception) if you want to prevent pregnancy. Osteoporosis and menopause Osteoporosis is a disease in which the bones lose minerals and strength with aging. This can result in bone fractures. If you are 66 years old or older, or if you are at risk for osteoporosis and fractures, ask your health care provider if you should: Be screened for bone loss. Take a calcium or vitamin D supplement to lower your risk of fractures. Be given hormone replacement therapy (HRT) to treat symptoms of menopause. Follow these instructions at home: Alcohol use Do not drink alcohol if: Your health care provider tells you not to drink. You are pregnant, may be pregnant, or are planning to become pregnant. If you drink alcohol: Limit how much you have to: 0-1 drink a day. Know how much alcohol is in your drink. In the [place] one drink equals one 12 oz bottle of beer (355 mL), one 5 oz glass of wine (148 mL), or one 1 oz glass of hard liquor (44 mL). Lifestyle Do not use any products that contain nicotine or tobacco. These products include cigarettes, chewing tobacco, and vaping devices, such as e-cigarettes. If you need help quitting, ask your health care provider. Do not use street drugs. Do not share needles. Ask your health care provider for help if you need support or information about quitting drugs. General instructions Schedule regular health, dental, and eye exams. Stay current with your vaccines. Tell your health care provider if: You often feel depressed. You have ever been abused or do not feel safe at home. Summary Adopting a healthy lifestyle and getting preventive care are important in promoting health and wellness. Follow your health care provider's instructions about healthy diet, exercising, and getting tested or screened for diseases. Follow your health care provider's  instructions on monitoring your cholesterol and blood pressure. This information is not intended to replace advice given to you by your health care provider. Make sure you discuss any questions you have with your health care provider. Document Revised: 12/05/2020 Document Reviewed: 12/05/2020 Elsevier Patient Education  2024 [employer].

## 2023-04-17 ENCOUNTER — Encounter: Payer: Self-pay | Admitting: Internal Medicine

## 2023-04-17 ENCOUNTER — Ambulatory Visit (INDEPENDENT_AMBULATORY_CARE_PROVIDER_SITE_OTHER): Payer: BC Managed Care – PPO | Admitting: Internal Medicine

## 2023-04-17 VITALS — BP 108/78 | HR 85 | Temp 98.5°F | Ht 66.0 in | Wt 137.0 lb

## 2023-04-17 DIAGNOSIS — E221 Hyperprolactinemia: Secondary | ICD-10-CM | POA: Diagnosis not present

## 2023-04-17 DIAGNOSIS — D500 Iron deficiency anemia secondary to blood loss (chronic): Secondary | ICD-10-CM

## 2023-04-17 DIAGNOSIS — D352 Benign neoplasm of pituitary gland: Secondary | ICD-10-CM

## 2023-04-17 DIAGNOSIS — Z0000 Encounter for general adult medical examination without abnormal findings: Secondary | ICD-10-CM | POA: Diagnosis not present

## 2023-04-17 MED ORDER — FAMOTIDINE 20 MG PO TABS: 20.0000 mg | ORAL_TABLET | Freq: Every day | ORAL | Status: AC | PRN

## 2023-04-17 NOTE — Progress Notes (Unsigned)
I,Victoria T Deloria Lair, CMA,acting as a Neurosurgeon for Gwynneth Aliment, MD.,have documented all relevant documentation on the behalf of Gwynneth Aliment, MD,as directed by  Gwynneth Aliment, MD while in the presence of Gwynneth Aliment, MD.  Subjective:  Patient ID: Rebecca [name] , female    DOB: [birth year] , 52 y.o.   MRN: [id number]  Chief Complaint  Patient presents with  . Annual Exam    HPI  Patient presents today for her physical. She is followed by Dr.Roberts for her GYN care. Patient has no concerns or questions at this time.   She asks if it is okay to get her flu shot today if she has to go back to the breast center for additional testing.      Past Medical History:  Diagnosis Date  . Iron deficiency anemia   . Leiomyoma      Family History  Problem Relation Age of Onset  . Lymphoma Mother   . Prostate cancer Father   . Breast cancer Neg Hx      Current Outpatient Medications:  .  cabergoline (DOSTINEX) 0.5 MG tablet, Take 0.5 mg by mouth 2 (two) times a week., Disp: , Rfl:  .  cholecalciferol (VITAMIN D3) 25 MCG (1000 UNIT) tablet, Take 1,000 Units by mouth daily., Disp: , Rfl:  .  famotidine (PEPCID) 20 MG tablet, TAKE 1 TABLET BY MOUTH EVERYDAY AT BEDTIME, Disp: 90 tablet, Rfl: 1 .  fluticasone (FLONASE) 50 MCG/ACT nasal spray, PLACE 1 SPRAY INTO BOTH NOSTRILS EVERY DAY AS NEEDED., Disp: 16 mL, Rfl: 1 .  Iron-FA-B Cmp-C-Biot-Probiotic (FUSION PLUS) CAPS, TAKE 1 CAPSULE BY MOUTH EVERY DAY (Patient taking differently: Take 1 capsule by mouth See admin instructions. Takes ONLY during cycle), Disp: 30 capsule, Rfl: 1 .  Multiple Vitamins-Minerals (MULTIVITAMIN GUMMIES WOMENS) CHEW, Dorna Bloom 2 each by mouth daily., Disp: , Rfl:    No Known Allergies   Review of Systems  Constitutional: Negative.   HENT: Negative.    Eyes: Negative.   Respiratory: Negative.    Cardiovascular: Negative.   Gastrointestinal: Negative.   Endocrine: Negative.   Genitourinary: Negative.    Musculoskeletal: Negative.   Skin: Negative.   Allergic/Immunologic: Negative.   Neurological: Negative.   Hematological: Negative.   Psychiatric/Behavioral: Negative.       Today's Vitals   04/17/23 1507  BP: 108/78  Pulse: 85  Temp: 98.5 F (36.9 C)  SpO2: 98%  Weight: 137 lb (62.1 kg)  Height: 5\' 6"  (1.676 m)   Body mass index is 22.11 kg/m.  Wt Readings from Last 3 Encounters:  04/17/23 137 lb (62.1 kg)  02/13/23 137 lb 9.6 oz (62.4 kg)  10/18/22 139 lb 9.6 oz (63.3 kg)     Objective:  Physical Exam Vitals and nursing note reviewed.  Constitutional:      Appearance: Normal appearance.  HENT:     Head: Normocephalic and atraumatic.  Eyes:     Extraocular Movements: Extraocular movements intact.  Cardiovascular:     Rate and Rhythm: Normal rate and regular rhythm.     Heart sounds: Normal heart sounds.  Pulmonary:     Effort: Pulmonary effort is normal.     Breath sounds: Normal breath sounds.  Musculoskeletal:     Cervical back: Normal range of motion.  Skin:    General: Skin is warm.  Neurological:     General: No focal deficit present.     Mental Status: She is alert.  Psychiatric:  Mood and Affect: Mood normal.        Behavior: Behavior normal.        Assessment And Plan:  Annual physical exam  Iron deficiency anemia due to chronic blood loss  Hyperprolactinemia ([facility])  Pituitary adenoma ([facility])  Gastroesophageal reflux disease without esophagitis     Return for 1 year HM.  Patient was given opportunity to ask questions. Patient verbalized understanding of the plan and was able to repeat key elements of the plan. All questions were answered to their satisfaction.    I, Gwynneth Aliment, MD, have reviewed all documentation for this visit. The documentation on 04/17/23 for the exam, diagnosis, procedures, and orders are all accurate and complete.   IF YOU HAVE BEEN REFERRED TO A SPECIALIST, IT MAY TAKE 1-2 WEEKS TO SCHEDULE/PROCESS THE  REFERRAL. IF YOU HAVE NOT HEARD FROM US/SPECIALIST IN TWO WEEKS, PLEASE GIVE [place] A CALL AT [phone] X [phone].   THE PATIENT IS ENCOURAGED TO PRACTICE SOCIAL DISTANCING DUE TO THE COVID-19 PANDEMIC.

## 2023-04-18 LAB — CMP14+EGFR
ALT: 13 IU/L (ref 0–32)
AST: 15 IU/L (ref 0–40)
Albumin: 4.8 g/dL (ref 3.8–4.9)
Alkaline Phosphatase: 67 IU/L (ref 44–121)
BUN/Creatinine Ratio: 16 (ref 9–23)
BUN: 13 mg/dL (ref 6–24)
Bilirubin Total: 0.4 mg/dL (ref 0.0–1.2)
CO2: 22 mmol/L (ref 20–29)
Calcium: 10.3 mg/dL — ABNORMAL HIGH (ref 8.7–10.2)
Chloride: 100 mmol/L (ref 96–106)
Creatinine, Ser: 0.79 mg/dL (ref 0.57–1.00)
Globulin, Total: 2.4 g/dL (ref 1.5–4.5)
Glucose: 86 mg/dL (ref 70–99)
Potassium: 4.8 mmol/L (ref 3.5–5.2)
Sodium: 138 mmol/L (ref 134–144)
Total Protein: 7.2 g/dL (ref 6.0–8.5)
eGFR: 90 mL/min/{1.73_m2} (ref >59)

## 2023-04-18 LAB — LIPID PANEL
Chol/HDL Ratio: 2.9 ratio (ref 0.0–4.4)
Cholesterol, Total: 177 mg/dL (ref 100–199)
HDL: 61 mg/dL (ref >39)
LDL Chol Calc (NIH): 97 mg/dL (ref 0–99)
Triglycerides: 104 mg/dL (ref 0–149)
VLDL Cholesterol Cal: 19 mg/dL (ref 5–40)

## 2023-04-18 LAB — CBC
Hematocrit: 39.8 % (ref 34.0–46.6)
Hemoglobin: 13.2 g/dL (ref 11.1–15.9)
MCH: 28.8 pg (ref 26.6–33.0)
MCHC: 33.2 g/dL (ref 31.5–35.7)
MCV: 87 fL (ref 79–97)
Platelets: 333 10*3/uL (ref 150–450)
RBC: 4.58 x10E6/uL (ref 3.77–5.28)
RDW: 12.4 % (ref 11.7–15.4)
WBC: 8.1 10*3/uL (ref 3.4–10.8)

## 2023-04-18 NOTE — Assessment & Plan Note (Signed)
A full exam was performed.  She was congratulated on recent participation in a formal exercise routine. We decided to delay her flu vaccine until after her additional breast studies.  Importance of monthly self breast exams was discussed with the patient.  She is advised to get 30-45 minutes of regular exercise, no less than four to five days per week. Both weight-bearing and aerobic exercises are recommended.  She is advised to follow a healthy diet with at least six fruits/veggies per day, decrease intake of red meat and other saturated fats and to increase fish intake to twice weekly.  Meats/fish should not be fried -- baked, boiled or broiled is preferable. It is also important to cut back on your sugar intake.  Be sure to read labels - try to avoid anything with added sugar, high fructose corn syrup or other sweeteners.  If you must use a sweetener, you can try stevia or monkfruit.  It is also important to avoid artificially sweetened foods/beverages and diet drinks. Lastly, wear SPF 50 sunscreen on exposed skin and when in direct sunlight for an extended period of time.  Be sure to avoid fast food restaurants and aim for at least 60 ounces of water daily.

## 2023-04-18 NOTE — Assessment & Plan Note (Signed)
Last MRI performed in 2022.  At that time, there was no change in size of adenoma. She is not experiencing any visual symptoms at this time.

## 2023-04-18 NOTE — Assessment & Plan Note (Signed)
Chronic, followed by Dr. Sharl Ma. Most recent notes have been requested. She is currently on cabergoline twice weekly.

## 2023-04-23 ENCOUNTER — Other Ambulatory Visit: Payer: Self-pay | Admitting: Internal Medicine

## 2023-04-23 ENCOUNTER — Ambulatory Visit
Admission: RE | Admit: 2023-04-23 | Discharge: 2023-04-23 | Disposition: A | Discharge status: Home / Self Care | Payer: BC Managed Care – PPO | Source: Ambulatory Visit | Attending: Internal Medicine | Admitting: Internal Medicine

## 2023-04-23 DIAGNOSIS — N6310 Unspecified lump in the right breast, unspecified quadrant: Secondary | ICD-10-CM

## 2023-04-23 DIAGNOSIS — R928 Other abnormal and inconclusive findings on diagnostic imaging of breast: Secondary | ICD-10-CM

## 2023-04-23 DIAGNOSIS — N6320 Unspecified lump in the left breast, unspecified quadrant: Secondary | ICD-10-CM

## 2023-04-24 ENCOUNTER — Encounter: Payer: Self-pay | Admitting: Internal Medicine

## 2023-04-24 NOTE — Progress Notes (Signed)
Encounter created in error

## 2023-04-30 ENCOUNTER — Ambulatory Visit: Payer: BC Managed Care – PPO

## 2023-05-01 ENCOUNTER — Other Ambulatory Visit: Payer: BC Managed Care – PPO

## 2023-05-25 IMAGING — US US THYROID
1 series · 13 of 25 positions shown · non-contrast
Comparison: None.

CLINICAL DATA: Goiter.

EXAM:
THYROID ULTRASOUND
TECHNIQUE: Ultrasound examination of the thyroid gland and adjacent soft
tissues was performed.

[Series 1: us thyroid · 0.05mm/px · 13 of 35 slices shown]
[im 1/35]
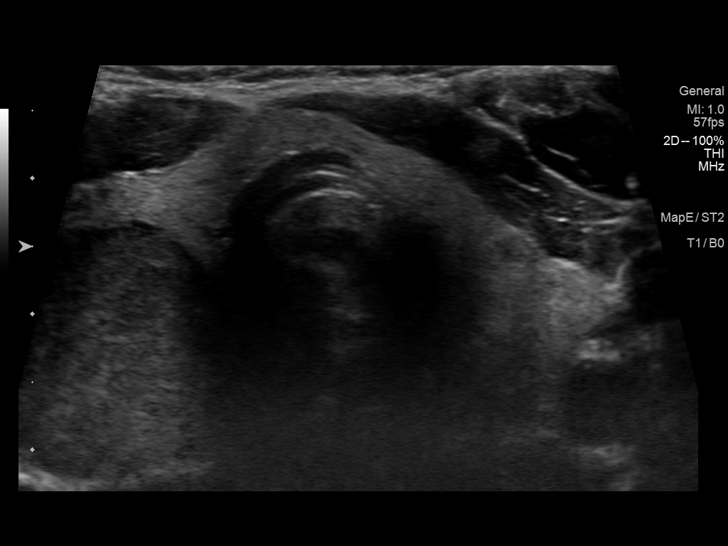
[im 3/35]
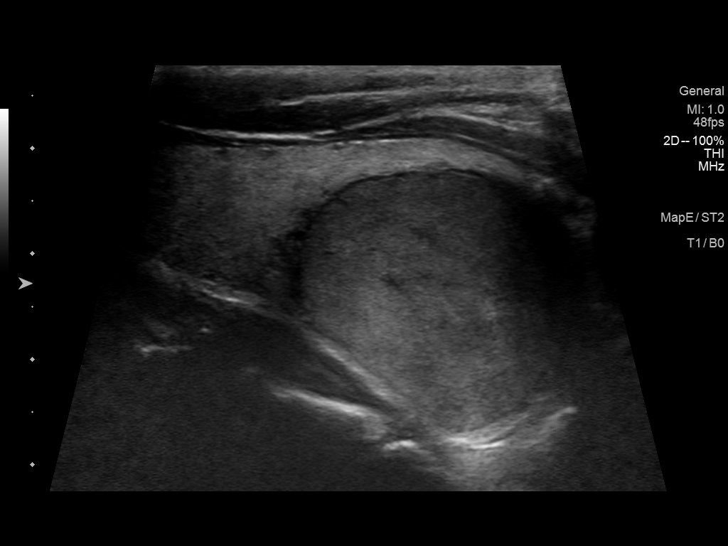
[im 6/35]
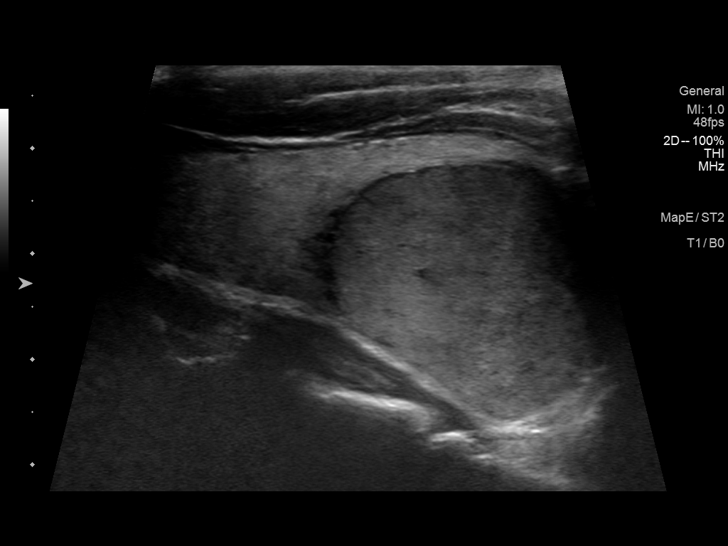
[im 9/35]
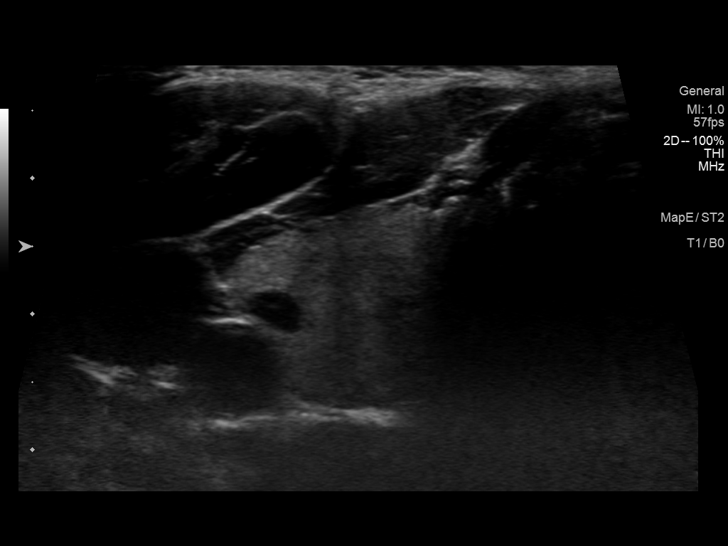
[im 12/35]
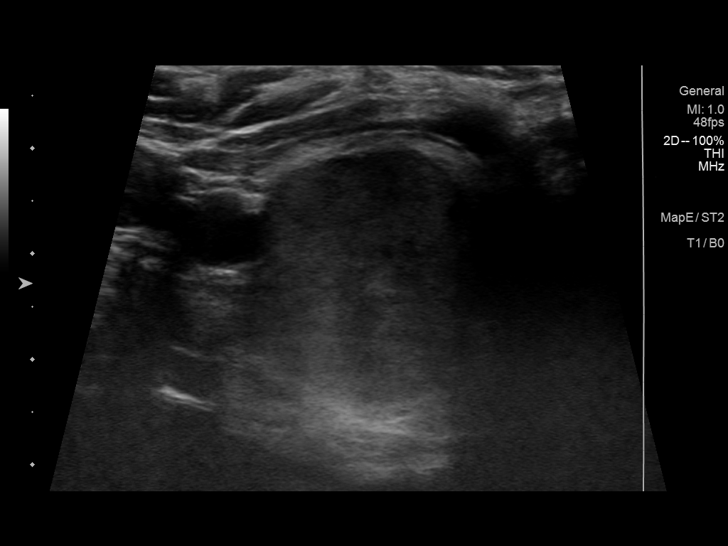
[im 15/35]
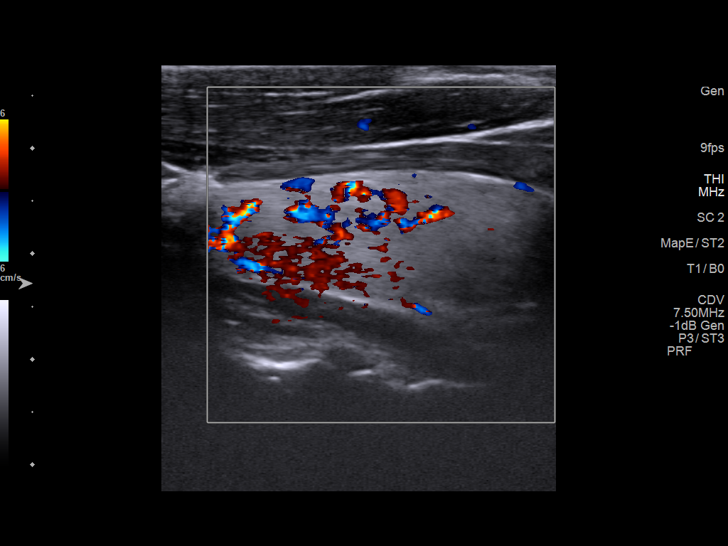
[im 18/35]
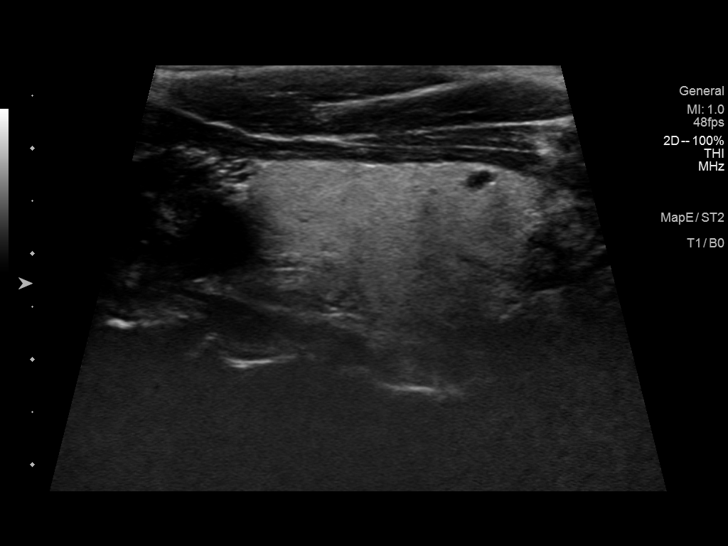
[im 20/35]
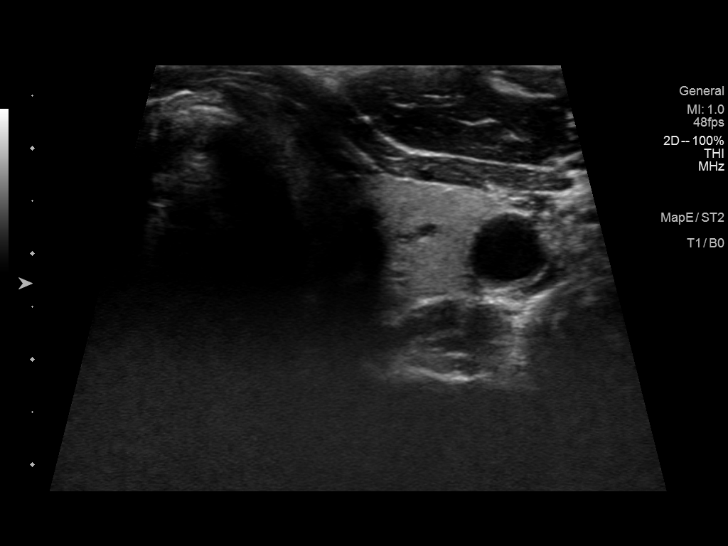
[im 23/35]
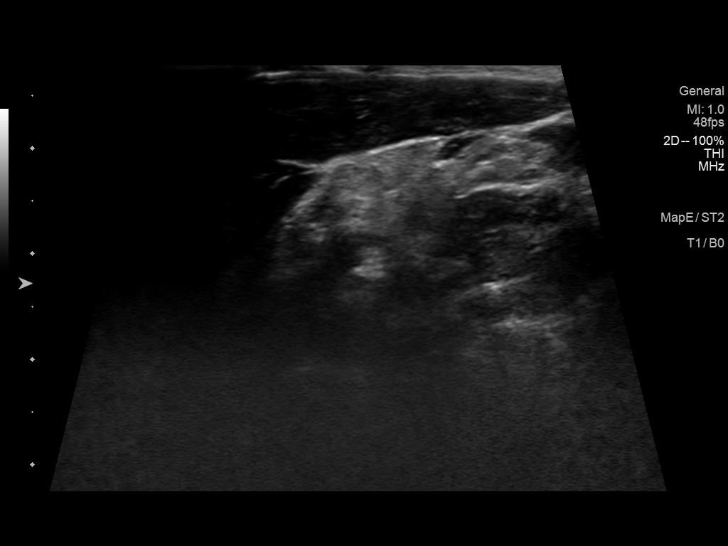
[im 26/35]
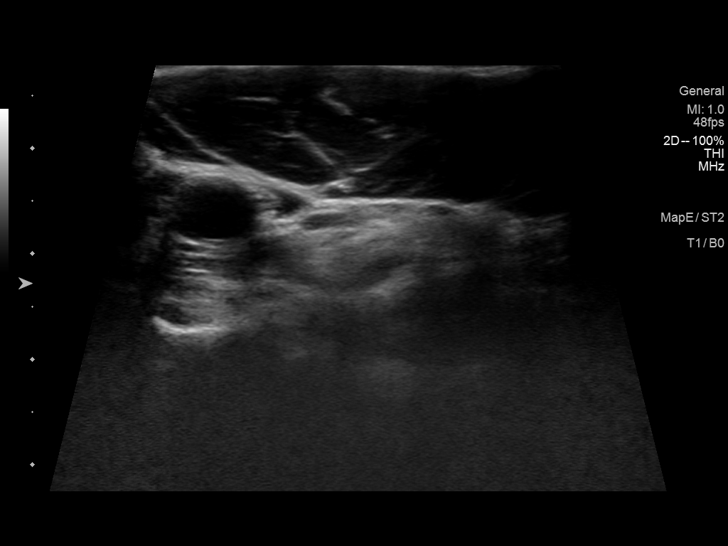
[im 29/35]
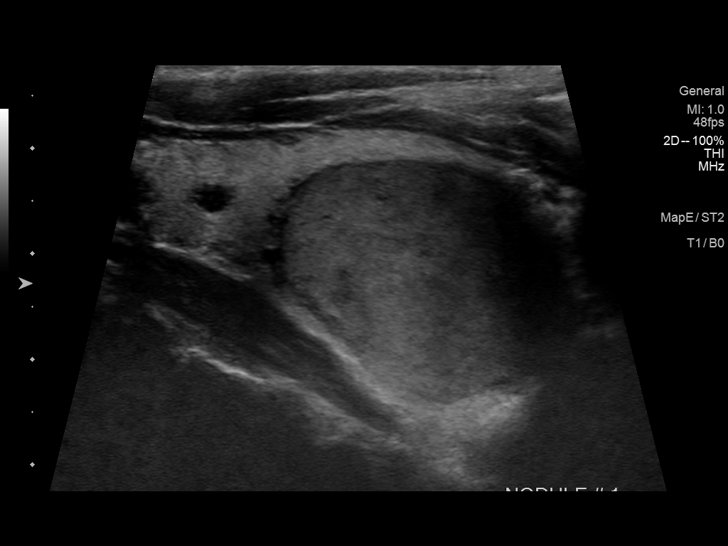
[im 32/35]
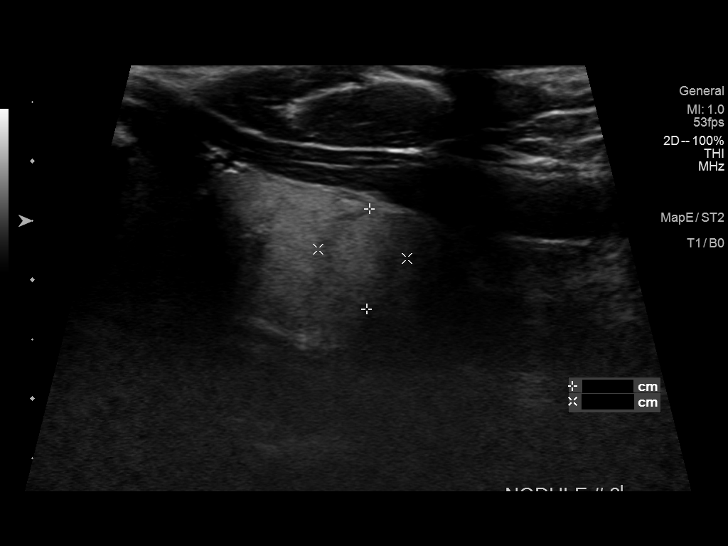
[im 35/35]
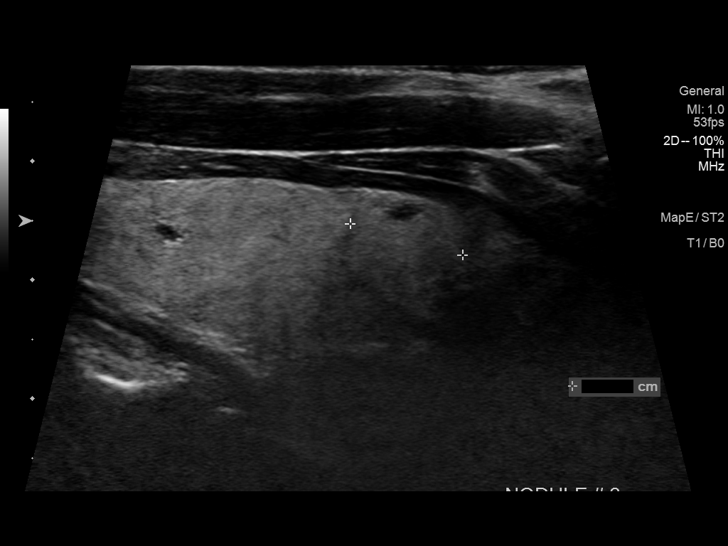

[13 of 25 positions shown; findings below may reference images not displayed]

FINDINGS: Parenchymal Echotexture: Mildly heterogenous

Isthmus: 4 mm

Right lobe: 4.8 x 2.7 x 2.1 cm

Left lobe: 3.5 x 1.2 x 1.5 cm

_________________________________________________________

Estimated total number of nodules >/= 1 cm: 1

Number of spongiform nodules >/=  2 cm not described below (TR1): 0

Number of mixed cystic and solid nodules >/= 1.5 cm not described
below (TR2): 0

_________________________________________________________

Nodule # 1:

Location: Right; Inferior

Maximum size: 2.8 cm; Other 2 dimensions: 2.5 x 2.3 cm

Composition: solid/almost completely solid (2)

Echogenicity: isoechoic (1)

Shape: not taller-than-wide (0)

Margins: smooth (0)

Echogenic foci: none (0)

ACR TI-RADS total points: 3.

ACR TI-RADS risk category: TR3 (3 points).

ACR TI-RADS recommendations:

**Given size (>/= 2.5 cm) and appearance, fine needle aspiration of
this mildly suspicious nodule should be considered based on TI-RADS
criteria.

_________________________________________________________

Nodule 2 represents an additional ill-defined solid isoechoic TR 3
type nodule measuring 1 cm. This would not meet criteria for any
biopsy or follow-up and is not fully described by TI rads criteria.

Minor thyroid heterogeneity.  No regional adenopathy.
IMPRESSION: 2.8 cm right inferior TR 3 nodule meets criteria for biopsy as
above.

The above is in keeping with the ACR TI-RADS recommendations - [HOSPITAL] 6032;[DATE].

## 2023-07-24 IMAGING — US US FNA BIOPSY THYROID 1ST LESION
1 series · 13 of 13 positions shown · non-contrast
Comparison: Ultrasound of the thyroid 07/14/2021

MEDICATIONS:
None

COMPLICATIONS:
None immediate.

INDICATION: Indeterminate thyroid nodule

EXAM:
ULTRASOUND GUIDED FINE NEEDLE ASPIRATION OF INDETERMINATE THYROID
NODULE
TECHNIQUE: Informed written consent was obtained from the patient after a
discussion of the risks, benefits and alternatives to treatment.
Questions regarding the procedure were encouraged and answered. A
timeout was performed prior to the initiation of the procedure.

[Series 1: us fna biopsy thyroid 1st lesion · 0.06mm/px · 13 acquisitions, 13 frames shown]
[im 1/13]
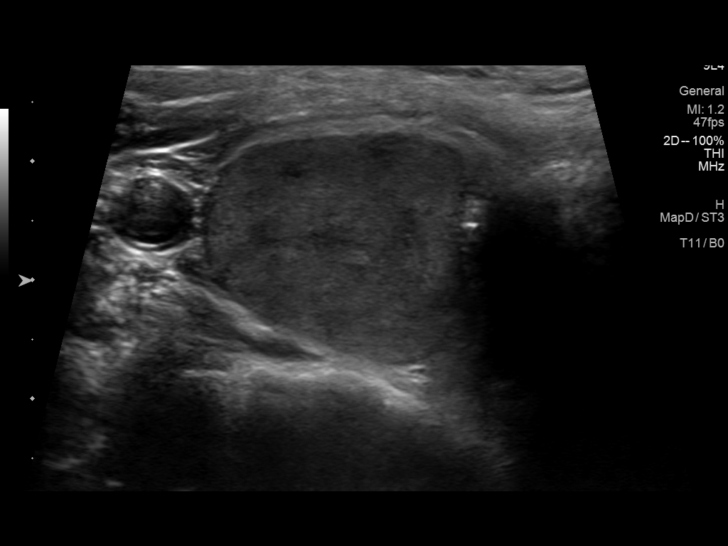
[im 2/13]
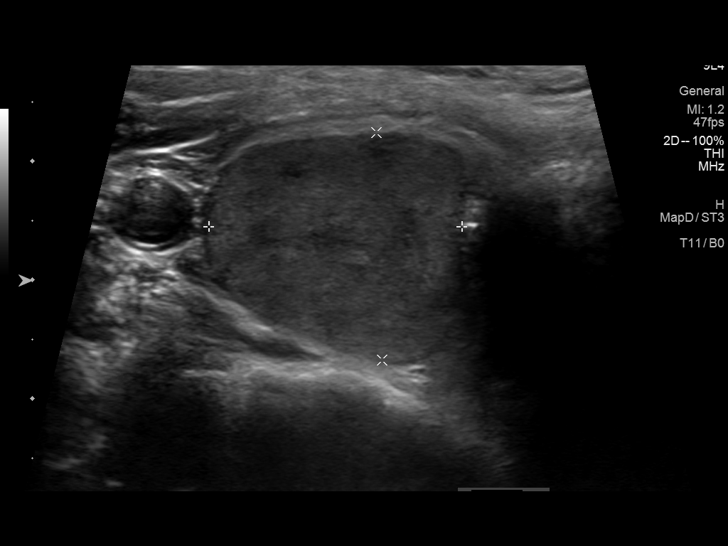
[im 3/13]
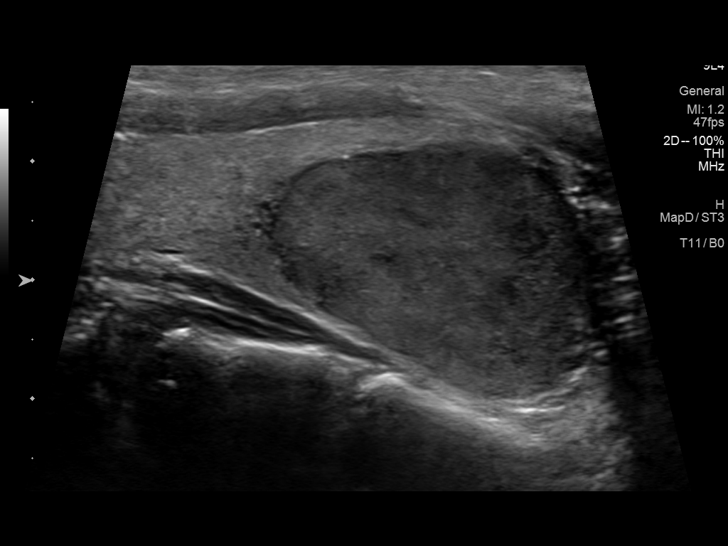
[im 4/13]
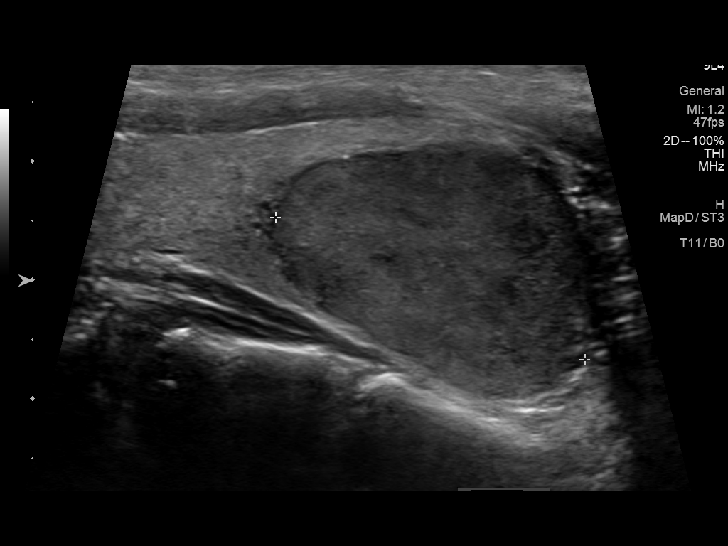
[im 5/13]
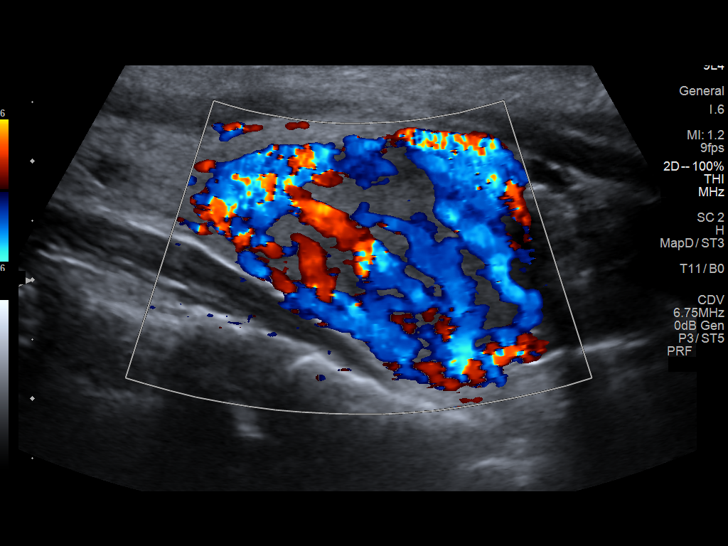
[im 6/13]
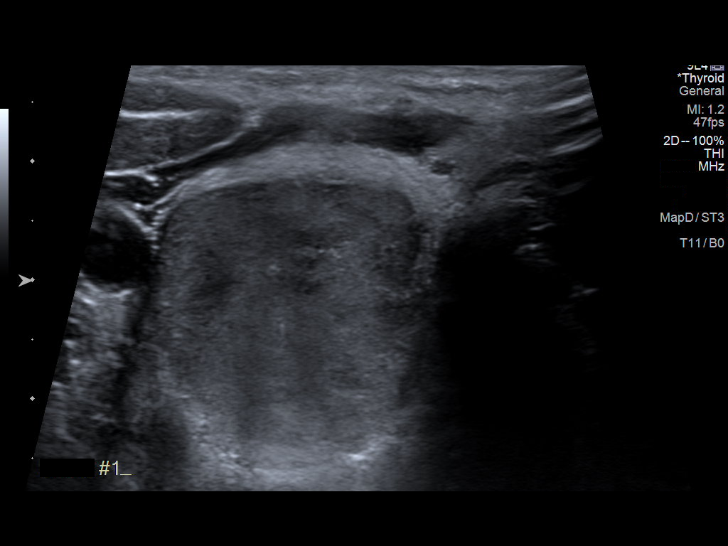
[im 7/13]
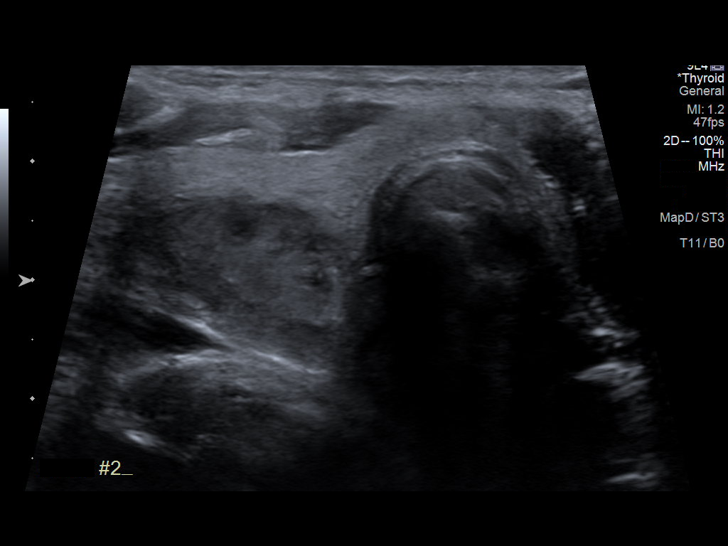
[im 8/13]
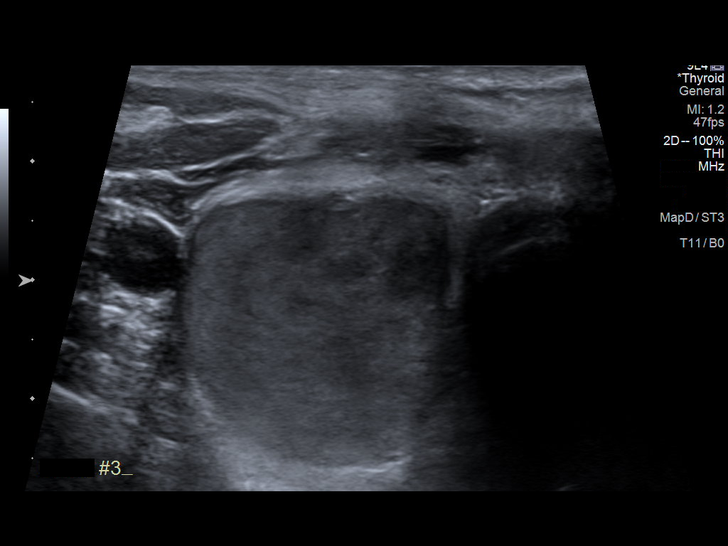
[im 9/13]
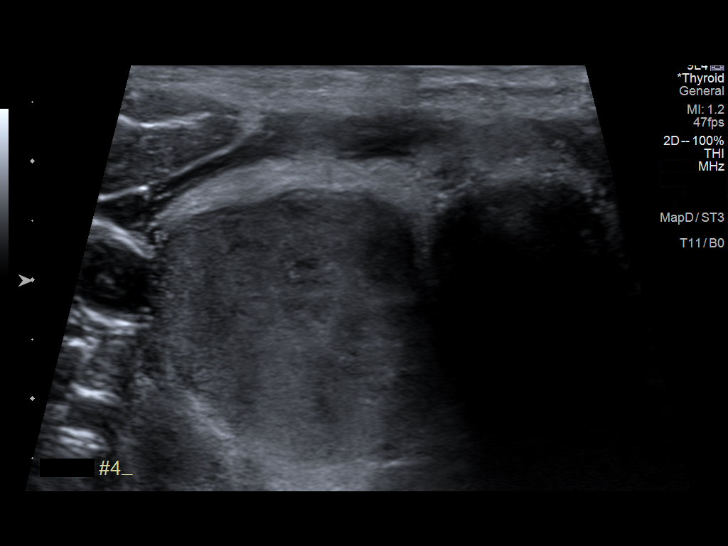
[im 10/13]
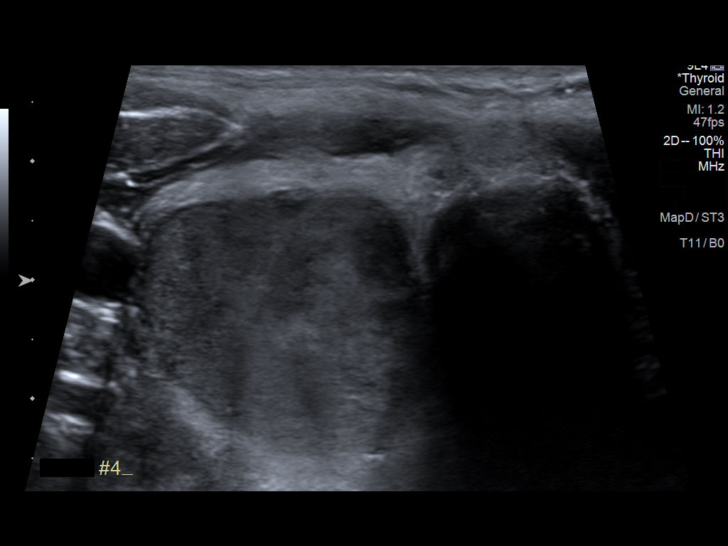
[im 11/13]
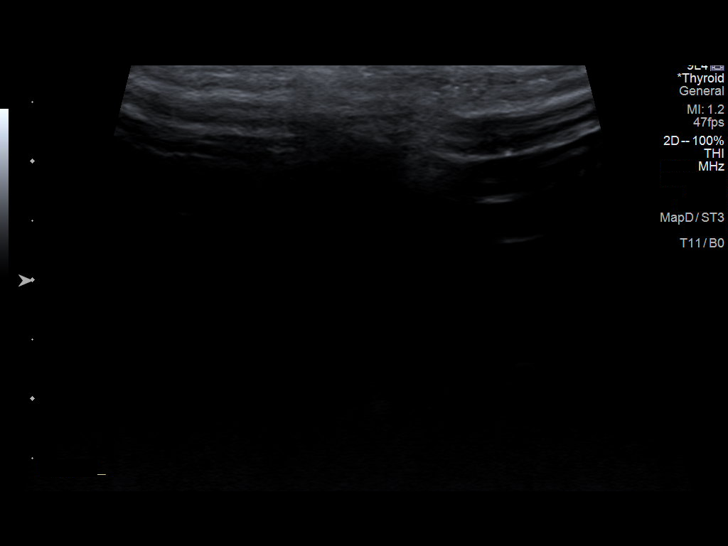
[im 12/13]
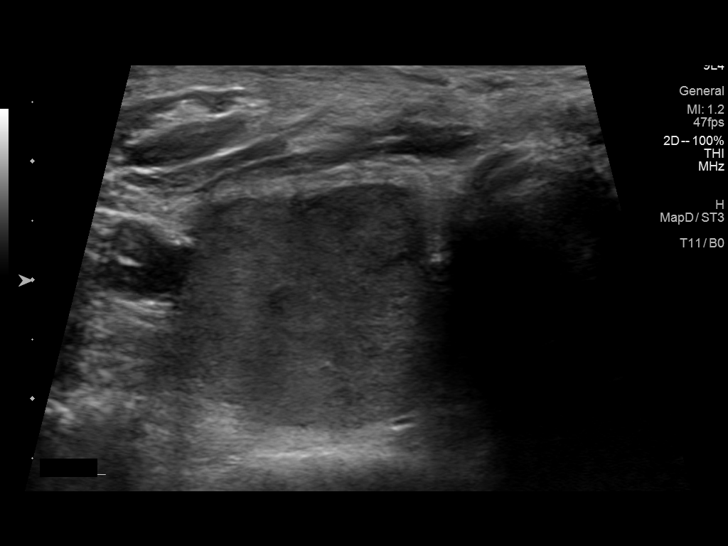
[im 13/13]
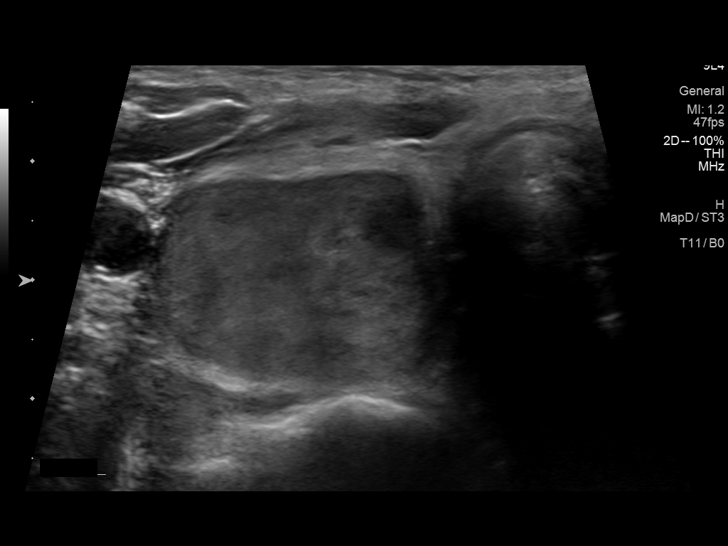

[13 of 13 positions shown; findings below may reference images not displayed]

Pre-procedural ultrasound scanning demonstrated unchanged size and
appearance of the indeterminate nodule within the right inferior
lobe

The procedure was planned. The neck was prepped in the usual sterile
fashion, and a sterile drape was applied covering the operative
field. A timeout was performed prior to the initiation of the
procedure. Local anesthesia was provided with 1% lidocaine.

Under direct ultrasound guidance, 5 FNA biopsies were performed of
the nodule with a 27 gauge needle. Multiple ultrasound images were
saved for procedural documentation purposes. The samples were
prepared and submitted to pathology. Two of the specimens were
reserved for Afirma testing.

Limited post procedural scanning was negative for hematoma or
additional complication. Dressings were placed. The patient
tolerated the above procedures procedure well without immediate
postprocedural complication.
FINDINGS: Nodule reference number based on prior diagnostic ultrasound: 1

Maximum size: 2.8 cm

Location: Right; inferior

ACR TI-RADS risk category: TR3 (3 points)

Reason for biopsy: meets ACR TI-RADS criteria

Ultrasound imaging confirms appropriate placement of the needles
within the thyroid nodule.
IMPRESSION: Technically successful ultrasound guided fine needle aspiration of
right inferior lobe nodule.

Performed and read by Lisle, Marcin Andrzej

## 2023-08-27 ENCOUNTER — Ambulatory Visit
Admission: RE | Admit: 2023-08-27 | Discharge: 2023-08-27 | Disposition: A | Discharge status: Home / Self Care | Payer: 59 | Source: Ambulatory Visit | Attending: Internal Medicine | Admitting: Internal Medicine

## 2023-08-27 DIAGNOSIS — N6320 Unspecified lump in the left breast, unspecified quadrant: Secondary | ICD-10-CM

## 2023-08-27 HISTORY — PX: BREAST BIOPSY: SHX20

## 2023-08-28 LAB — SURGICAL PATHOLOGY

## 2023-09-30 ENCOUNTER — Ambulatory Visit: Payer: Self-pay | Admitting: Internal Medicine

## 2023-11-12 ENCOUNTER — Encounter: Payer: Self-pay | Admitting: Internal Medicine

## 2023-11-12 ENCOUNTER — Ambulatory Visit: Payer: Self-pay | Admitting: Internal Medicine

## 2023-11-12 ENCOUNTER — Other Ambulatory Visit: Payer: Self-pay

## 2023-11-12 VITALS — BP 110/80 | HR 88 | Temp 98.3°F | Ht 66.0 in | Wt 142.6 lb

## 2023-11-12 DIAGNOSIS — J301 Allergic rhinitis due to pollen: Secondary | ICD-10-CM

## 2023-11-12 DIAGNOSIS — E041 Nontoxic single thyroid nodule: Secondary | ICD-10-CM | POA: Diagnosis not present

## 2023-11-12 DIAGNOSIS — E221 Hyperprolactinemia: Secondary | ICD-10-CM

## 2023-11-12 MED ORDER — FUSION PLUS PO CAPS: ORAL_CAPSULE | ORAL | 1 refills | Status: AC

## 2023-11-12 MED ORDER — CETIRIZINE HCL 10 MG PO TABS: 10.0000 mg | ORAL_TABLET | Freq: Every day | ORAL | Status: AC

## 2023-11-12 MED ORDER — FLUTICASONE PROPIONATE 50 MCG/ACT NA SUSP: NASAL | 1 refills | Status: AC

## 2023-11-12 NOTE — Patient Instructions (Signed)
 Thyroid Nodule  A thyroid nodule is an isolated growth of thyroid cells that forms a lump in the thyroid gland. The thyroid gland is a butterfly-shaped gland found in the lower front of the neck. It sends chemical messengers (hormones) through the blood to all parts of the body. These hormones are important in regulating body temperature and helping the body use energy. Thyroid nodules are common. Most are not cancerous (are benign). You may have one nodule or several nodules. There are different types of thyroid nodules. They include nodules that: Grow and fill with fluid (thyroid cysts). Produce too much thyroid hormone (hot nodules or hyperthyroid). Produce no thyroid hormone (cold nodules or hypothyroid). Form from cancer cells (thyroid cancers). What are the causes? In most cases, the cause of thyroid nodules is not known. What increases the risk? The following factors may make you more likely to develop thyroid nodules: Age. Thyroid nodules are more common in people who are older than 45 years. Female gender. A family history that includes: Thyroid nodules. Pheochromocytoma. Thyroid carcinoma. Hyperparathyroidism. Certain thyroid diseases, such as Hashimoto's thyroiditis. Lack of iodine in your diet. A history of head and neck radiation, such as from cancer treatments. Type 2 diabetes. What are the signs or symptoms? In many cases, there are no symptoms. If you have symptoms, they may include: A lump in your lower neck. Feeling pressure, fullness, or a tickle in your throat. Pain in your neck, jaw, or ear. Having trouble swallowing or breathing. Hot nodules may cause: Weight loss. Warm, flushed skin. Feeling hot. Feeling nervous. A rapid or irregular heartbeat. Cold nodules may cause: Weight gain. Dry skin. Hair loss, brittle hair, or both. Feeling cold. Fatigue. Thyroid cancer nodules may cause: Hard nodules that can be felt along the thyroid  gland. Hoarseness. Lumps in the tissue (lymph nodes) near your thyroid gland. How is this diagnosed? A thyroid nodule may be felt by your health care provider during a physical exam. This condition may also be diagnosed based on your symptoms. You may also have tests, including: Blood tests to check how well your thyroid is working. An ultrasound. This may be done to confirm the diagnosis. A biopsy. This involves taking a sample from the nodule and looking at it under a microscope. A thyroid scan. This test creates an image of the thyroid gland using a radioactive tracer. Imaging tests such as an MRI or CT scan. These may be done if: A nodule is large. A nodule is blocking your airway. Cancer is suspected. How is this treated? Treatment depends on the cause and size of your nodule or nodules. If a nodule is benign, treatment may not be necessary. Your health care provider may monitor the nodule to see if it goes away without treatment. If a nodule continues to grow, is cancerous, or does not go away, treatment may be needed. Treatment may include: Having a cystic nodule drained with a needle. Ablation therapy. In this treatment, alcohol is injected into the area of the nodule to destroy the cells. Ablation with heat may also be used. This is called thermal ablation. Radioactive iodine. In this treatment, radioactive iodine is given as a pill or liquid that you drink. This substance causes the thyroid nodule to shrink. Surgery to remove the nodule or nodules. Part or all of your thyroid gland may also need to be removed. Medicines to treat hyperthyroidism. Follow these instructions at home: Pay attention to any changes in your thyroid nodule or nodules. Take over-the-counter  and prescription medicines only as told by your health care provider. Keep all follow-up visits. This is important. Contact a health care provider if: You have trouble sleeping. You have muscle weakness. You have  significant weight loss without changing your eating habits. You feel nervous. You have trouble swallowing. You have increased swelling. You have a rapid or irregular heartbeat. Get help right away if: You have chest pain. You faint or lose consciousness. Your nodule makes it hard for you to breathe. These symptoms may be an emergency. Get help right away. Call 911. Do not wait to see if the symptoms will go away. Do not drive yourself to the hospital. Summary A thyroid nodule is an isolated growth of thyroid cells that forms a lump in your thyroid gland. Thyroid nodules are common. Most are not cancerous. Your health care provider may monitor the nodule to see if it goes away without treatment. If a nodule continues to grow, is cancerous, or does not go away, treatment may be needed. Treatment depends on the cause and size of your nodule or nodules. This information is not intended to replace advice given to you by your health care provider. Make sure you discuss any questions you have with your health care provider. Document Revised: 05/29/2021 Document Reviewed: 05/29/2021 Elsevier Patient Education  2024 [employer].

## 2023-11-12 NOTE — Progress Notes (Signed)
 I,Victoria T Basil Lim, CMA,acting as a Neurosurgeon for Smiley Dung, MD.,have documented all relevant documentation on the behalf of Smiley Dung, MD,as directed by  Smiley Dung, MD while in the presence of Smiley Dung, MD.  Subjective:  Patient ID: Rebecca [name] , female    DOB: 1971/04/18 , 53 y.o.   MRN: [id number]  Chief Complaint  Patient presents with   Thyroid  Follow Up    She presents today for thyroid  follow up. She currently does not take any prescribed medications for thyroid .  While here today she experiences sinus issues. She feels lots of congestion in her sinus cavities along with drainage. Denies fever/ chills.  She has not yet completed pap.    HPI Discussed the use of AI scribe software for clinical note transcription with the patient, who gave verbal consent to proceed.  History of Present Illness Rebecca [name] is a 53 year old female who presents for a thyroid  check and sinus congestion.  She has been experiencing sinus congestion for approximately two weeks, initially attributing it to pollen. Over the past weekend, the congestion worsened significantly. She has been using over-the-counter medications, including Day/Night allergy sinus medications, Nyquil, and a nasal spray, but reports no improvement in her symptoms. She denies using Allegra, Zyrtec , or Claritin for her allergies. Her prescription for Flonase  had run out and was recently refilled. She takes her nasal spray twice a day, once in the morning and once in the evening.  She has a history of thyroid  issues and elevated prolactin levels. She undergoes blood work every couple of months and had her last lab work on December 2nd, followed by a one-year follow-up on December 5th. She is currently taking cabergoline for her prolactin levels.  She is followed by Dr. Kathyanne Parkers at [facility].   In terms of her social history, she does not exercise regularly. She acknowledges the importance of strength training for bone  health, especially given her thyroid  condition.  During the review of symptoms, she reports feeling drainage in her throat but denies any infection-related redness. She mentions that her allergies are typically seasonal, and this year has been particularly severe.   Past Medical History:  Diagnosis Date   Iron deficiency anemia    Leiomyoma      Family History  Problem Relation Age of Onset   Lymphoma Mother    Prostate cancer Father    Breast cancer Neg Hx      Current Outpatient Medications:    cabergoline (DOSTINEX) 0.5 MG tablet, Take 0.5 mg by mouth 2 (two) times a week., Disp: , Rfl:    cetirizine  (ZYRTEC  ALLERGY) 10 MG tablet, Take 1 tablet (10 mg total) by mouth daily., Disp: , Rfl:    cholecalciferol (VITAMIN D3) 25 MCG (1000 UNIT) tablet, Take 1,000 Units by mouth daily., Disp: , Rfl:    famotidine  (PEPCID ) 20 MG tablet, Take 1 tablet (20 mg total) by mouth daily as needed for heartburn or indigestion., Disp: , Rfl:    Multiple Vitamins-Minerals (MULTIVITAMIN GUMMIES WOMENS) CHEW, Chew 2 each by mouth daily., Disp: , Rfl:    fluticasone  (FLONASE ) 50 MCG/ACT nasal spray, PLACE 1 SPRAY INTO BOTH NOSTRILS EVERY DAY AS NEEDED., Disp: 16 mL, Rfl: 1   Iron-FA-B Cmp-C-Biot-Probiotic (FUSION PLUS) CAPS, Takes ONLY during cycle, Disp: 90 capsule, Rfl: 1   No Known Allergies   Review of Systems  Constitutional: Negative.   HENT:  Positive for congestion and postnasal drip.   Respiratory: Negative.  Cardiovascular: Negative.   Gastrointestinal: Negative.   Neurological: Negative.   Psychiatric/Behavioral: Negative.       Today's Vitals   11/12/23 1557  BP: 110/80  Pulse: 88  Temp: 98.3 F (36.8 C)  SpO2: 98%  Weight: 142 lb 9.6 oz (64.7 kg)  Height: 5\' 6"  (1.676 m)   Body mass index is 23.02 kg/m.  Wt Readings from Last 3 Encounters:  11/12/23 142 lb 9.6 oz (64.7 kg)  04/17/23 137 lb (62.1 kg)  02/13/23 137 lb 9.6 oz (62.4 kg)     Objective:  Physical  Exam Vitals and nursing note reviewed.  Constitutional:      Appearance: Normal appearance.  HENT:     Head: Normocephalic and atraumatic.     Right Ear: Tympanic membrane, ear canal and external ear normal. There is no impacted cerumen.     Left Ear: Tympanic membrane, ear canal and external ear normal. There is no impacted cerumen.     Mouth/Throat:     Pharynx: Posterior oropharyngeal erythema present.  Eyes:     Extraocular Movements: Extraocular movements intact.  Cardiovascular:     Rate and Rhythm: Normal rate and regular rhythm.     Heart sounds: Normal heart sounds.  Pulmonary:     Effort: Pulmonary effort is normal.     Breath sounds: Normal breath sounds.  Musculoskeletal:     Cervical back: Normal range of motion.  Skin:    General: Skin is warm.  Neurological:     General: No focal deficit present.     Mental Status: She is alert.  Psychiatric:        Mood and Affect: Mood normal.        Behavior: Behavior normal.         Assessment And Plan:  Thyroid  nodule Assessment & Plan: She is s/p right thyroid  lobectomy in 2023.  Thyroid  and prolactin levels monitored by Dr. Gordy Lauber. Follow-up in December 2024, records not sent to current provider. Continues current thyroid  management as per Dr. Gearldean Keepers recommendations. - Ensure records from Dr. Susana Enter are sent to current provider for continuity of care. - Continue current thyroid  management as per Dr. Gearldean Keepers recommendations.   Hyperprolactinemia (HCC) Assessment & Plan: Chronic, followed by Dr. Kathyanne Parkers. Most recent notes have been requested. She is currently on cabergoline twice weekly.    Seasonal allergic rhinitis due to pollen Assessment & Plan: Sinus congestion likely due to pollen exposure. Symptoms include nasal congestion, drainage, and throat irritation. Over-the-counter treatments ineffective. Symptoms seasonal, may persist through May or June. - Provide Norel AD for short-term relief, caution  about drowsiness, limit use to four days. - Prescribe Zyrtec  samples to assess tolerance, advised this can cause drowsines - Advise using Flonase  nasal spray for flare-ups, offer prescription if needed.   Other orders -     Cetirizine  HCl; Take 1 tablet (10 mg total) by mouth daily.   Return if symptoms worsen or fail to improve.  Patient was given opportunity to ask questions. Patient verbalized understanding of the plan and was able to repeat key elements of the plan. All questions were answered to their satisfaction.   I, Smiley Dung, MD, have reviewed all documentation for this visit. The documentation on 11/12/23 for the exam, diagnosis, procedures, and orders are all accurate and complete.   IF YOU HAVE BEEN REFERRED TO A SPECIALIST, IT MAY TAKE 1-2 WEEKS TO SCHEDULE/PROCESS THE REFERRAL. IF YOU HAVE NOT HEARD FROM US /SPECIALIST IN TWO WEEKS, PLEASE  GIVE US  A CALL AT [phone] X [phone].   THE PATIENT IS ENCOURAGED TO PRACTICE SOCIAL DISTANCING DUE TO THE COVID-19 PANDEMIC.

## 2023-11-19 DIAGNOSIS — J301 Allergic rhinitis due to pollen: Secondary | ICD-10-CM | POA: Insufficient documentation

## 2023-11-19 NOTE — Assessment & Plan Note (Signed)
Chronic, followed by Dr. Sharl Ma. Most recent notes have been requested. She is currently on cabergoline twice weekly.

## 2023-11-19 NOTE — Assessment & Plan Note (Addendum)
 She is s/p right thyroid  lobectomy in 2023.  Thyroid  and prolactin levels monitored by Dr. Gordy Lauber. Follow-up in December 2024, records not sent to current provider. Continues current thyroid  management as per Dr. Gearldean Keepers recommendations. - Ensure records from Dr. Susana Enter are sent to current provider for continuity of care. - Continue current thyroid  management as per Dr. Gearldean Keepers recommendations.

## 2023-11-19 NOTE — Assessment & Plan Note (Addendum)
 Sinus congestion likely due to pollen exposure. Symptoms include nasal congestion, drainage, and throat irritation. Over-the-counter treatments ineffective. Symptoms seasonal, may persist through May or June. - Provide Norel AD for short-term relief, caution about drowsiness, limit use to four days. - Prescribe Zyrtec  samples to assess tolerance, advised this can cause drowsines - Advise using Flonase  nasal spray for flare-ups, offer prescription if needed.

## 2024-04-21 ENCOUNTER — Encounter: Payer: Self-pay | Admitting: Internal Medicine

## 2024-04-21 ENCOUNTER — Ambulatory Visit (INDEPENDENT_AMBULATORY_CARE_PROVIDER_SITE_OTHER): Payer: BC Managed Care – PPO | Admitting: Internal Medicine

## 2024-04-21 VITALS — BP 110/70 | HR 91 | Temp 98.2°F | Ht 66.0 in | Wt 145.0 lb

## 2024-04-21 DIAGNOSIS — D352 Benign neoplasm of pituitary gland: Secondary | ICD-10-CM

## 2024-04-21 DIAGNOSIS — Z0000 Encounter for general adult medical examination without abnormal findings: Secondary | ICD-10-CM

## 2024-04-21 DIAGNOSIS — E221 Hyperprolactinemia: Secondary | ICD-10-CM

## 2024-04-21 DIAGNOSIS — Z23 Encounter for immunization: Secondary | ICD-10-CM

## 2024-04-21 NOTE — Assessment & Plan Note (Signed)
 Last MRI performed in 2022.  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  At that time, there was no change in size of adenoma. She is not experiencing any visual symptoms at this time.

## 2024-04-21 NOTE — Assessment & Plan Note (Signed)

## 2024-04-21 NOTE — Progress Notes (Signed)
 I,Rebecca [name], CMA,acting as a Neurosurgeon for Rebecca LOISE Slocumb, MD.,have documented all relevant documentation on the behalf of Rebecca LOISE Slocumb, MD,as directed by  Rebecca LOISE Slocumb, MD while in the presence of Rebecca LOISE Slocumb, MD.  Subjective:    Patient ID: Rebecca [name] , female    DOB: [birth year] , 53 y.o.   MRN: [id number]  Chief Complaint  Patient presents with   Annual Exam    Patient presents today for her physical. She is followed by Rebecca [name] for her GYN care. Patient has no concerns or questions at this time.       HPI Discussed the use of AI scribe software for clinical note transcription with the patient, who gave verbal consent to proceed.  History of Present Illness     She presents today for a full physical exam. She is followed by Rebecca [name] for her GYN exam. She can't recall her last visit, but states she is due for PAP.    She has no specific concerns at this time. She feels well and has joined an exercise program at [facility]  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      Past Medical History:  Diagnosis Date   Iron deficiency anemia    Leiomyoma      Family History  Problem Relation Age of Onset   Lymphoma Mother    Prostate cancer Father    Breast cancer Neg Hx      Current Outpatient Medications:    cabergoline (DOSTINEX) 0.5 MG tablet, Take 0.5 mg by mouth 2 (two) times a week., Disp: , Rfl:    cetirizine  (ZYRTEC  ALLERGY) 10 MG tablet, Take 1 tablet (10 mg total) by mouth daily., Disp: , Rfl:    cholecalciferol (VITAMIN D3) 25 MCG (1000 UNIT) tablet, Take 1,000 Units by mouth daily., Disp: , Rfl:    famotidine  (PEPCID ) 20 MG tablet, Take 1 tablet (20 mg total) by mouth daily as needed for heartburn or indigestion., Disp: , Rfl:    fluticasone  (FLONASE ) 50 MCG/ACT nasal spray, PLACE 1 SPRAY INTO BOTH NOSTRILS EVERY DAY AS NEEDED., Disp: 16 mL, Rfl: 1   Iron-FA-B Cmp-C-Biot-Probiotic (FUSION  PLUS) CAPS, Takes ONLY during cycle, Disp: 90 capsule, Rfl: 1   Multiple Vitamins-Minerals (MULTIVITAMIN GUMMIES WOMENS) CHEW, Chew 2 each by mouth daily., Disp: , Rfl:    No Known Allergies    The patient states she uses none for birth control. Patient's last menstrual period was 04/17/2024.. Negative for Dysmenorrhea. Negative for: breast discharge, breast lump(s), breast pain and breast self exam. Associated symptoms include abnormal vaginal bleeding. Pertinent negatives include abnormal bleeding (hematology), anxiety, decreased libido, depression, difficulty falling sleep, dyspareunia, history of infertility, nocturia, sexual dysfunction, sleep disturbances, urinary incontinence, urinary urgency, vaginal discharge and vaginal itching. Diet regular.The patient states her exercise level is  intermittent.  She attends an exercise class twice weekly.   . The patient's tobacco use is:  Social History   Tobacco Use  Smoking Status Never  Smokeless Tobacco Never  . She has been exposed to passive smoke. The patient's alcohol use is:  Social History   Substance and Sexual Activity  Alcohol Use Never    Review of Systems  Constitutional: Negative.   HENT: Negative.    Eyes: Negative.   Respiratory: Negative.    Cardiovascular: Negative.   Gastrointestinal: Negative.   Endocrine: Negative.   Genitourinary: Negative.   Musculoskeletal: Negative.  Skin: Negative.   Allergic/Immunologic: Negative.   Neurological: Negative.   Hematological: Negative.   Psychiatric/Behavioral: Negative.       Today's Vitals   04/21/24 1458  BP: 110/70  Pulse: 91  Temp: 98.2 F (36.8 C)  TempSrc: Oral  Weight: 145 lb (65.8 kg)  Height: 5' 6 (1.676 m)  PainSc: 0-No pain   Body mass index is 23.4 kg/m.  Wt Readings from Last 3 Encounters:  04/21/24 145 lb (65.8 kg)  11/12/23 142 lb 9.6 oz (64.7 kg)  04/17/23 137 lb (62.1 kg)     The 10-year ASCVD risk score (Arnett DK, et al., 2019) is:  1.2%   Values used to calculate the score:     Age: 9 years     Clincally relevant sex: Female     Is Non-Hispanic African American: Yes     Diabetic: No     Tobacco smoker: No     Systolic Blood Pressure: 110 mmHg     Is BP treated: No     HDL Cholesterol: 61 mg/dL     Total Cholesterol: 177 mg/dL  Objective:  Physical Exam Vitals and nursing note reviewed.  Constitutional:      Appearance: Normal appearance.  HENT:     Head: Normocephalic and atraumatic.     Right Ear: Tympanic membrane, ear canal and external ear normal.     Left Ear: Tympanic membrane, ear canal and external ear normal.     Mouth/Throat:     Pharynx: No oropharyngeal exudate or posterior oropharyngeal erythema.  Eyes:     Extraocular Movements: Extraocular movements intact.     Conjunctiva/sclera: Conjunctivae normal.     Pupils: Pupils are equal, round, and reactive to light.  Neck:     Thyroid : No thyroid  tenderness.     Comments: Healed surgical scar Cardiovascular:     Rate and Rhythm: Normal rate and regular rhythm.     Pulses: Normal pulses.     Heart sounds: Normal heart sounds.  Pulmonary:     Effort: Pulmonary effort is normal.     Breath sounds: Normal breath sounds.  Chest:  Breasts:    Tanner Score is 5.     Right: Normal.     Left: Normal.  Abdominal:     General: Abdomen is flat. Bowel sounds are normal.     Palpations: Abdomen is soft.  Genitourinary:    Comments: deferred Musculoskeletal:        General: Normal range of motion.     Cervical back: Normal range of motion and neck supple.  Skin:    General: Skin is warm and dry.  Neurological:     General: No focal deficit present.     Mental Status: She is alert and oriented to person, place, and time.  Psychiatric:        Mood and Affect: Mood normal.        Behavior: Behavior normal.     IMAGING: MRI HEAD WITHOUT AND WITH CONTRAST   TECHNIQUE: Multiplanar, multiecho pulse sequences of the brain and  surrounding structures were obtained without and with intravenous contrast.   CONTRAST:  6mL MULTIHANCE  GADOBENATE DIMEGLUMINE  529 MG/ML IV SOLN   COMPARISON:  08/09/2012   FINDINGS: Brain: No acute infarct, mass effect or extra-axial collection. No acute or chronic hemorrhage. Normal white matter signal, parenchymal volume and CSF spaces.   Pituitary/Sella: 5 mm area dynamic hypoenhancement at the left aspect of the pituitary gland is unchanged. This remains visible  only on dynamic postcontrast imaging. A normal posterior pituitary bright spot is seen. The infundibulum is midline. The hypothalamus and mamillary bodies are normal. There is no mass effect on the optic chiasm or optic nerves. The infundibular and chiasmatic recesses are clear. Normal cavernous sinus and cavernous internal carotid artery flow voids.   Vascular: Major flow voids are preserved.   Skull and upper cervical spine: Normal calvarium and skull base. Visualized upper cervical spine and soft tissues are normal.   Sinuses/Orbits:Multiple maxillary sinus retention cysts. No mastoid effusion. Normal orbits.   IMPRESSION: 1. Unchanged 5 mm pituitary microadenoma. This remains visible only on dynamic postcontrast imaging. 2. Otherwise normal MRI of the brain.    Assessment And Plan:     Annual physical exam Assessment & Plan: A full exam was performed.  Importance of monthly self breast exams was discussed with the patient.  She is advised to get 30-45 minutes of regular exercise, no less than four to five days per week. Both weight-bearing and aerobic exercises are recommended.  She is advised to follow a healthy diet with at least six fruits/veggies per day, decrease intake of red meat and other saturated fats and to increase fish intake to twice weekly.  Meats/fish should not be fried -- baked, boiled or broiled is preferable. It is also important to cut back on your sugar intake.  Be sure to read labels - try  to avoid anything with added sugar, high fructose corn syrup or other sweeteners.  If you must use a sweetener, you can try stevia or monkfruit.  It is also important to avoid artificially sweetened foods/beverages and diet drinks. Lastly, wear SPF 50 sunscreen on exposed skin and when in direct sunlight for an extended period of time.  Be sure to avoid fast food restaurants and aim for at least 60 ounces of water  daily.      Orders: -     CBC -     CMP14+EGFR -     Hemoglobin A1c -     Lipid panel -     TSH  Pituitary adenoma [facility]) Assessment & Plan: Last MRI performed in 2022.  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  At that time, there was no change in size of adenoma. She is not experiencing any visual symptoms at this time.    Hyperprolactinemia Assessment & Plan: Chronic, followed by Rebecca [name]. Most recent notes have been requested. She is currently on cabergoline twice weekly.    Need for influenza vaccination -     Flu vaccine trivalent PF, 6mos and older(Flulaval,Afluria,Fluarix,Fluzone)     Return for 1 year physical. Patient was given opportunity to ask questions. Patient verbalized understanding of the plan and was able to repeat key elements of the plan. All questions were answered to their satisfaction.   I, Rebecca LOISE Slocumb, MD, have reviewed all documentation for this visit. The documentation on 04/21/24 for the exam, diagnosis, procedures, and orders are all accurate and complete.

## 2024-04-21 NOTE — Assessment & Plan Note (Signed)
Chronic, followed by Dr. Sharl Ma. Most recent notes have been requested. She is currently on cabergoline twice weekly.

## 2024-04-21 NOTE — Patient Instructions (Signed)

## 2024-04-22 LAB — CMP14+EGFR
ALT: 11 IU/L (ref 0–32)
AST: 12 IU/L (ref 0–40)
Albumin: 4.6 g/dL (ref 3.8–4.9)
Alkaline Phosphatase: 62 IU/L (ref 49–135)
BUN/Creatinine Ratio: 13 (ref 9–23)
BUN: 10 mg/dL (ref 6–24)
Bilirubin Total: 0.6 mg/dL (ref 0.0–1.2)
CO2: 21 mmol/L (ref 20–29)
Calcium: 9.7 mg/dL (ref 8.7–10.2)
Chloride: 104 mmol/L (ref 96–106)
Creatinine, Ser: 0.77 mg/dL (ref 0.57–1.00)
Globulin, Total: 2.2 g/dL (ref 1.5–4.5)
Glucose: 82 mg/dL (ref 70–99)
Potassium: 4.2 mmol/L (ref 3.5–5.2)
Sodium: 140 mmol/L (ref 134–144)
Total Protein: 6.8 g/dL (ref 6.0–8.5)
eGFR: 92 mL/min/1.73 (ref >59)

## 2024-04-22 LAB — CBC
Hematocrit: 36.8 % (ref 34.0–46.6)
Hemoglobin: 12.3 g/dL (ref 11.1–15.9)
MCH: 29.2 pg (ref 26.6–33.0)
MCHC: 33.4 g/dL (ref 31.5–35.7)
MCV: 87 fL (ref 79–97)
Platelets: 321 x10E3/uL (ref 150–450)
RBC: 4.21 x10E6/uL (ref 3.77–5.28)
RDW: 12.8 % (ref 11.7–15.4)
WBC: 7.6 x10E3/uL (ref 3.4–10.8)

## 2024-04-22 LAB — LIPID PANEL
Chol/HDL Ratio: 2.5 ratio (ref 0.0–4.4)
Cholesterol, Total: 156 mg/dL (ref 100–199)
HDL: 62 mg/dL (ref >39)
LDL Chol Calc (NIH): 80 mg/dL (ref 0–99)
Triglycerides: 71 mg/dL (ref 0–149)
VLDL Cholesterol Cal: 14 mg/dL (ref 5–40)

## 2024-04-22 LAB — HEMOGLOBIN A1C
Est. average glucose Bld gHb Est-mCnc: 108 mg/dL
Hgb A1c MFr Bld: 5.4 % (ref 4.8–5.6)

## 2024-04-22 LAB — TSH: TSH: 2.76 u[IU]/mL (ref 0.450–4.500)

## 2024-04-27 ENCOUNTER — Ambulatory Visit: Payer: Self-pay | Admitting: Internal Medicine

## 2025-04-27 ENCOUNTER — Encounter: Payer: Self-pay | Admitting: Internal Medicine
